# Patient Record
Sex: Male | Born: 1970 | Race: White | Hispanic: No | Marital: Married | Smoking: Never smoker
Health system: Southern US, Community
[De-identification: ages and names within clinical notes are randomized; demographics above are authoritative.]

---

## 2007-05-31 ENCOUNTER — Ambulatory Visit: Payer: Self-pay | Admitting: Sports Medicine

## 2007-05-31 DIAGNOSIS — M25569 Pain in unspecified knee: Secondary | ICD-10-CM | POA: Insufficient documentation

## 2013-11-03 DIAGNOSIS — I10 Essential (primary) hypertension: Secondary | ICD-10-CM | POA: Insufficient documentation

## 2013-11-03 DIAGNOSIS — N50819 Testicular pain, unspecified: Secondary | ICD-10-CM | POA: Insufficient documentation

## 2014-10-12 DIAGNOSIS — F419 Anxiety disorder, unspecified: Secondary | ICD-10-CM | POA: Insufficient documentation

## 2015-05-17 ENCOUNTER — Encounter: Payer: Self-pay | Admitting: Family Medicine

## 2015-05-17 ENCOUNTER — Ambulatory Visit (INDEPENDENT_AMBULATORY_CARE_PROVIDER_SITE_OTHER): Payer: BC Managed Care – PPO | Admitting: Family Medicine

## 2015-05-17 VITALS — BP 131/74 | HR 61 | Ht 73.0 in | Wt 180.0 lb

## 2015-05-17 DIAGNOSIS — M25561 Pain in right knee: Secondary | ICD-10-CM | POA: Diagnosis not present

## 2015-05-17 NOTE — Progress Notes (Signed)
Patient ID: Martin York, male   DOB: November 09, 1970, 44 y.o.   MRN: [id number] Attending Note: I have seen and examined this patient. I have discussed this patient with the resident and reviewed the assessment and plan as documented above. I agree with the resident's findings and plan. Right knee pain, mostly medial. Pain occurs mostly with running, rarely with cycling. I suspect he has a little bit at all meniscal injury given his prior history of anterior cruciate ligament rupture and repair. I think at this point were obligated to get an MRI. Ultrasound today didn't show anything unusual including his menisci which appear to be normal. He did have a few osteophytes seen but otherwise negative ultrasound. Given his high demand activities running cycling etc. of think getting an MRI will give [place] a better idea of future problems and options for treatment right now.

## 2015-05-17 NOTE — Patient Instructions (Signed)
Thanks for coming in today.   We will get an MRI of your knee. You will be called with the appointment time.   In the meantime, ice your knee whenever it hurts, and you can perform activity as you are able to tolerate.   If you have any questions, or need anything, don't hesitate to call.   Thanks for letting us take care of you.   Sincerely,  Devota Pace, MD Family Medicine - PGY 2

## 2015-05-17 NOTE — Progress Notes (Signed)
   [facility] Yolande Jolly, MD Phone: [phone]  Subjective:   # Right Knee Pain - Pain on the medial aspect of the right knee for over one year now. - Patient has had a prior anterior cruciate ligament injury with 2 scopes and an anterior cruciate ligament repair after sustaining an injury in soccer during which she planted and twisted inward while being struck from the side by another player. - He is an avid cyclist, and was running, but the pain has stopped him for money at this point. - He does not get much pain when cycling. - He is a Runner, broadcasting/film/video, and he does get some pain with standing for long periods of time. - Pain is worse with running downhill, worse with foot strike, and not particularly worse at any point during his run, hurts the same the entire time. - He denies joint instability, clicks, locking, giving way. - Has not required pain medication. - Has not done any home rehabilitation. - Says he comes in today because the pain is getting worse. - His knee does not swell. - He gets relief with rest.   All relevant systems were reviewed and were negative unless otherwise noted in the HPI  Past Medical History Reviewed problem list.  Medications- reviewed and updated Current Outpatient Prescriptions  Medication Sig Dispense Refill  . chlorthalidone (HYGROTON) 25 MG tablet Take 25 mg by mouth.     No current facility-administered medications for this visit.   Chief complaint-noted No additions to family history Social history- patient is a non smoker  Objective: BP 124/71 mmHg  Pulse 58  Ht  (1.626 m)  Wt 165 lb (74.844 kg)  BMI 28.31 kg/m2  INSPECTION: No gross deformity, no swelling, no erythema.  PALPATION: Tenderness on the medial aspect at the joint line, and just below the joint line. No tenderness to palpation at any other area of the knee. Some mild crepitus noted. No effusion palpable. No warmth.  RANGE OF MOTION: Full range  of motion from 0-150/160  STRENGTH: 5 out of 5 strength with flexion, extension.  SPECIAL TESTS: Lockman negative, anterior forte/posterior drawer negative, McMurray/Thessaly negative, no medial or lateral collateral ligament laxity.  NEUROVASCULAR STATUS: Neurovascularly intact.    Assessment/Plan:  # Right knee pain- patient with right knee pain most likely related to his prior injury during which he sustained an anterior cruciate ligament tear from a lateral blow. This seems to be most consistent with a probable meniscal tear based on historian based on his symptoms. Nonspecific, possible small tear on ultrasound.  - Plan to get an MRI to further evaluate the knee.  - Activity as tolerated  - Ice if he has pain, ibuprofen if he needs it.  -We will await MRI results, and have him return once we get those back. Further intervention as indicated.

## 2015-05-31 ENCOUNTER — Other Ambulatory Visit: Payer: BC Managed Care – PPO

## 2016-05-15 LAB — HIV ANTIBODY (ROUTINE TESTING W REFLEX): HIV 1&2 Ab, 4th Generation: NONREACTIVE

## 2016-05-22 ENCOUNTER — Other Ambulatory Visit: Payer: Self-pay | Admitting: Sports Medicine

## 2016-06-03 ENCOUNTER — Ambulatory Visit
Admission: RE | Admit: 2016-06-03 | Discharge: 2016-06-03 | Disposition: A | Discharge status: Home / Self Care | Payer: BC Managed Care – PPO | Source: Ambulatory Visit | Attending: Sports Medicine | Admitting: Sports Medicine

## 2016-06-08 ENCOUNTER — Other Ambulatory Visit: Payer: Self-pay | Admitting: Sports Medicine

## 2016-06-08 DIAGNOSIS — K7030 Alcoholic cirrhosis of liver without ascites: Secondary | ICD-10-CM

## 2016-06-16 ENCOUNTER — Ambulatory Visit
Admission: RE | Admit: 2016-06-16 | Discharge: 2016-06-16 | Disposition: A | Discharge status: Home / Self Care | Payer: BC Managed Care – PPO | Source: Ambulatory Visit | Attending: Sports Medicine | Admitting: Sports Medicine

## 2016-06-16 DIAGNOSIS — K7030 Alcoholic cirrhosis of liver without ascites: Secondary | ICD-10-CM

## 2016-06-16 MED ORDER — GADOXETATE DISODIUM 0.25 MMOL/ML IV SOLN
10.0000 mL | Freq: Once | INTRAVENOUS | Status: AC | PRN
  Administered 2016-06-16: 8 mL via INTRAVENOUS

## 2016-06-22 ENCOUNTER — Encounter (INDEPENDENT_AMBULATORY_CARE_PROVIDER_SITE_OTHER): Payer: Self-pay | Admitting: Sports Medicine

## 2016-06-22 ENCOUNTER — Ambulatory Visit (INDEPENDENT_AMBULATORY_CARE_PROVIDER_SITE_OTHER): Payer: BC Managed Care – PPO | Admitting: Sports Medicine

## 2016-06-22 VITALS — BP 134/71 | HR 46 | Ht 73.0 in | Wt 175.0 lb

## 2016-06-22 DIAGNOSIS — K7460 Unspecified cirrhosis of liver: Secondary | ICD-10-CM

## 2016-06-22 LAB — CBC AND DIFFERENTIAL
HEMATOCRIT: 41 (ref 41–53)
Hemoglobin: 14.1 (ref 13.5–17.5)
Platelets: 218 (ref 150–399)
WBC: 5.8

## 2016-06-22 LAB — HEPATIC FUNCTION PANEL
ALT: 32 (ref 10–40)
AST: 31 (ref 14–40)
Alkaline Phosphatase: 43 (ref 25–125)
Bilirubin, Total: 1

## 2016-06-22 LAB — PROTIME-INR: PROTIME: 11.2 (ref 10.0–13.8)

## 2016-06-22 LAB — BASIC METABOLIC PANEL
BUN: 15 (ref 4–21)
Creatinine: 1 (ref 0.6–1.3)
GLUCOSE: 80
POTASSIUM: 4 (ref 3.4–5.3)
Sodium: 141 (ref 137–147)

## 2016-06-22 LAB — POCT INR: INR: 1.1 (ref 0.9–1.1)

## 2016-06-22 NOTE — Progress Notes (Deleted)
   Office Visit Note   Patient: Martin York           Date of Birth: 08-May-1971           MRN: [id number] Visit Date: 06/22/2016              PCP: Andrena MewsMichael D Tigran Haynie, DO  Assessment & Plan: Visit Diagnoses: No diagnosis found.  Plan: ***  Follow-Up Instructions: No Follow-up on file.   Orders:  No orders of the defined types were placed in this encounter.  No orders of the defined types were placed in this encounter.     Procedures: No procedures performed   Clinical Data: No additional findings.   Subjective: Chief Complaint  Patient presents with  . Follow-up    Post MRI- Pt. here to discuss MRI results    HPI  Review of Systems   Objective: Vital Signs: BP 134/71   Pulse (!) 46   Ht 6\' 1"  (1.854 m)   Wt 175 lb (79.4 kg)   BMI 23.09 kg/m   Physical Exam  Ortho Exam  Specialty Comments:  No specialty comments available.  Imaging: No results found.   PMFS History: Patient Active Problem List   Diagnosis Date Noted  . KNEE PAIN, RIGHT 05/31/2007   No past medical history on file.  No family history on file.  No past surgical history on file. Social History   Occupational History  . Not on file.   Social History Main Topics  . Smoking status: Never Smoker  . Smokeless tobacco: Not on file  . Alcohol use Not on file  . Drug use: Unknown  . Sexual activity: Not on file

## 2016-06-22 NOTE — Progress Notes (Signed)
Martin York - 45 y.o. male MRN [id number]  Date of birth: 1971/02/24  Office Visit Note: Visit Date: 06/22/2016 PCP: Andrena MewsMichael D Rigby, DO Referred by: Everlene OtherBouska   Assessment & Plan: Visit Diagnoses:  1. Cirrhosis of liver without ascites, unspecified hepatic cirrhosis type (HCC)   2. Hyperbilirubinemia    Plan: Long discussion with the patient today regarding long-term use of alcohol. 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 Does report some use of prior IM testosterone use but only short-term use. Given the current findings discussed that using alcohol in any form should be avoided & he is agreeable with this. We will plan to check labs as below & if any abnormalities referral to gastroenterology will be indicated however if AFP levels are stable I am comfortable trending his bilirubin every 3-4 months as long as he continues to avoid alcohol. He is taking milk thistle over-the-counter & I agree this is likely okay to continue. Once again if any unexpected abnormalities on lab work today formal evaluation by gastroenterology & consideration of liver biopsy should be considered.  F/u MRI in 6 months.  Follow-Up Instructions: Return in about 3 months (around 09/22/2016) for for repeat lab work.   Orders:  Orders Placed This Encounter  Procedures  . MR ABDOMEN MRCP W WO CONTAST  . Comprehensive metabolic panel  . CBC  . AFP tumor marker  . INR/PT  . Bilirubin, fractionated (tot/dir/indir)  . IBC panel  No orders of the defined types were placed in this encounter.  No procedures performed  No notes on file   Clinical History: Findings:  Lab abnormalities dating back to 2016 revealed an elevated total bilirubin of 1.6 that is remaining stable with a direct bilirubin of 0.3 most recently on 05/14/2016.  ALT & AST values have  normalized although markedly elevated when worked up at an outside provider. Viral Hepatitis panel is negative  MRI of the abdomen obtained following abnormal ultrasound which revealed: Diffuse contour irregularity consistent with cirrhosis with large area of heterogeneous signal alteration posteriorly in the right hepatic lobe most consistent with confluent hepatic fibrosis. No focal mass lesion. Will need follow-up MRI in 6 months  No specialty comments available.  Subjective: Chief Complaint  Patient presents with  . Follow-up    Post MRI- Pt. here to discuss MRI results   Patient here for follow-up of MRI of the abdomen. Overall feeling well no focal physical complaints. Has remained highly active with cycling. Reports fairly heavy alcohol use but has been able to remain sober for the past several months without difficulty. 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  He denies any chronic skin changes. No appreciable evidence of jaundice. No nausea vomiting or significant fatigue related. No issues with DTs or withdrawal-like symptoms. No history of reported IV drug use.   Review of Systems  Constitutional: Negative.        Otherwise per HPI    Objective: Vital Signs: BP 134/71   Pulse (!) 46   Ht 6\' 1"  (1.854 m)   Wt 175 lb (79.4 kg)   BMI 23.09 kg/m Physical Exam  Constitutional: He appears well-developed and well-nourished. No distress.  Eyes: Right eye exhibits no discharge. Left eye exhibits no discharge. No scleral icterus.  Pulmonary/Chest: Effort  normal. No respiratory distress.  Abdominal: Soft.  Skin: He is not diaphoretic.  Psychiatric: He has a normal mood and affect. Judgment normal.   Ortho Exam Imaging: No results found.  Past Medical/Family/Surgical/Social History: Patient Active Problem List   Diagnosis Date Noted  . Cirrhosis of liver without ascites (HCC) 06/22/2016  . Hyperbilirubinemia 06/22/2016  . KNEE PAIN, RIGHT  05/31/2007   No past medical history on file.   No family history on file.   No past surgical history on file.  Social History   Occupational History  . Teacher [employer]    [employer]   Social History Main Topics  . Smoking status: Never Smoker  . Smokeless tobacco: Not on file  . Alcohol use 0.0 oz/week  . Drug use: Unknown  . Sexual activity: Not on file

## 2016-07-02 ENCOUNTER — Encounter (INDEPENDENT_AMBULATORY_CARE_PROVIDER_SITE_OTHER): Payer: Self-pay | Admitting: Sports Medicine

## 2016-07-03 NOTE — Progress Notes (Signed)
Patient's labs otherwise completely normalize including bilirubin & liver enzymes. Given the marginally elevated AFP I am comfortable rechecking this in 3 months to ensure resolution of the values. If persistent elevation at that time further workup by gastroenterology will be indicated.  Did call & leave a message with the patient today & have tried reaching up to multiple times during the week but is been unavailable. I will try totouch base with him next week to ensure that he agrees with the plan. Alternatively we can go ahead & refer him to gastroenterology at this time but my suspicion for any type of hepatocellular carcinoma is extraordinarily low given the normalization of his bilirubin & liver enzymes.

## 2017-01-07 ENCOUNTER — Telehealth: Payer: Self-pay

## 2017-01-07 NOTE — Telephone Encounter (Signed)
Left voicemail & telephone encounter note for patient to call main office and schedule follow up with Dr Berline Choughigby.

## 2017-01-07 NOTE — Telephone Encounter (Signed)
-----   Message from Andrena MewsMichael D Rigby, DO sent at 01/07/2017  8:33 AM EDT ----- Regarding: FW: Recheck Labs Please call and have pt schedule a follow up appointment with me to recheck labs ----- Message ----- From: Andrena Mewsigby, Michael D, DO Sent: 08/20/2016  11:27 AM To: Andrena MewsMichael D Rigby, DO Subject: Recheck Labs                                   Needs follow up AFP and LFTs

## 2017-02-12 ENCOUNTER — Telehealth: Payer: Self-pay

## 2017-02-12 DIAGNOSIS — K7460 Unspecified cirrhosis of liver: Secondary | ICD-10-CM

## 2017-02-12 NOTE — Telephone Encounter (Signed)
[facility] [address]. [facility] and will fax to draw station.

## 2017-02-15 LAB — CBC
HEMATOCRIT: 40.7 % (ref 38.5–50.0)
Hemoglobin: 13.7 g/dL (ref 13.2–17.1)
MCH: 29.3 pg (ref 27.0–33.0)
MCHC: 33.7 g/dL (ref 32.0–36.0)
MCV: 87 fL (ref 80.0–100.0)
MPV: 10 fL (ref 7.5–12.5)
Platelets: 223 10*3/uL (ref 140–400)
RBC: 4.68 MIL/uL (ref 4.20–5.80)
RDW: 12.9 % (ref 11.0–15.0)
WBC: 5.6 10*3/uL (ref 3.8–10.8)

## 2017-02-16 LAB — COMPREHENSIVE METABOLIC PANEL
ALBUMIN: 4.5 g/dL (ref 3.6–5.1)
ALK PHOS: 43 U/L (ref 40–115)
ALT: 18 U/L (ref 9–46)
AST: 21 U/L (ref 10–40)
BUN: 11 mg/dL (ref 7–25)
CALCIUM: 9.2 mg/dL (ref 8.6–10.3)
CHLORIDE: 104 mmol/L (ref 98–110)
CO2: 28 mmol/L (ref 20–31)
Creat: 0.93 mg/dL (ref 0.60–1.35)
Glucose, Bld: 80 mg/dL (ref 65–99)
Potassium: 4 mmol/L (ref 3.5–5.3)
Sodium: 140 mmol/L (ref 135–146)
TOTAL PROTEIN: 6.2 g/dL (ref 6.1–8.1)
Total Bilirubin: 2 mg/dL — ABNORMAL HIGH (ref 0.2–1.2)

## 2017-02-16 LAB — IRON AND TIBC
%SAT: 39 % (ref 15–60)
Iron: 112 ug/dL (ref 50–180)
TIBC: 287 ug/dL (ref 250–425)
UIBC: 175 ug/dL

## 2017-02-16 LAB — BILIRUBIN, FRACTIONATED(TOT/DIR/INDIR)
Bilirubin, Direct: 0.3 mg/dL — ABNORMAL HIGH (ref <=0.2)
Indirect Bilirubin: 1.7 mg/dL — ABNORMAL HIGH (ref 0.2–1.2)
Total Bilirubin: 2 mg/dL — ABNORMAL HIGH (ref 0.2–1.2)

## 2017-02-16 LAB — BILIRUBIN,DIRECT & INDIRECT (FRACTIONATED)
BILIRUBIN DIRECT: 0.3 mg/dL — AB (ref <=0.2)
Indirect Bilirubin: 1.7 mg/dL — ABNORMAL HIGH (ref 0.2–1.2)

## 2017-02-16 LAB — PROTIME-INR
INR: 1.1
PROTHROMBIN TIME: 11.3 s (ref 9.0–11.5)

## 2017-02-16 LAB — AFP TUMOR MARKER: AFP-Tumor Marker: 5.2 ng/mL (ref <6.1)

## 2017-02-17 ENCOUNTER — Ambulatory Visit: Payer: BC Managed Care – PPO | Admitting: Sports Medicine

## 2017-02-17 ENCOUNTER — Telehealth: Payer: Self-pay

## 2017-02-17 NOTE — Telephone Encounter (Signed)
Notes recorded by Andrena Mewsigby, Michael D, DO on 02/17/2017 at 3:10 PM EDT AFP level has normalized. Persistently elevated bilirubin. Liver enzymes are normal. Bilirubin is slightly higher than in the past but not significantly worsened. Would like to plan to see him back in clinic to review these results and talk about the next appropriate step. Please call to schedule an appointment

## 2017-02-17 NOTE — Telephone Encounter (Signed)
Called pt and left VM for pt to contact the office.

## 2017-02-18 ENCOUNTER — Encounter: Payer: Self-pay | Admitting: Sports Medicine

## 2017-02-18 ENCOUNTER — Ambulatory Visit (INDEPENDENT_AMBULATORY_CARE_PROVIDER_SITE_OTHER): Payer: BC Managed Care – PPO | Admitting: Sports Medicine

## 2017-02-18 VITALS — BP 122/100 | HR 42 | Ht 73.0 in | Wt 179.4 lb

## 2017-02-18 DIAGNOSIS — I10 Essential (primary) hypertension: Secondary | ICD-10-CM | POA: Diagnosis not present

## 2017-02-18 DIAGNOSIS — Z23 Encounter for immunization: Secondary | ICD-10-CM | POA: Diagnosis not present

## 2017-02-18 DIAGNOSIS — K7460 Unspecified cirrhosis of liver: Secondary | ICD-10-CM

## 2017-02-18 NOTE — Progress Notes (Signed)
OFFICE VISIT NOTE Veverly FellsMichael D. Delorise Shinerigby, DO  [facility] [place] [phone]  Dorene ArRobert B Callens - 46 y.o. male MRN [id number]  Date of birth: [birth year]  Visit Date: 02/18/2017  PCP: Andrena Mewsigby, Michael D, DO   Referred by: Andrena Mewsigby, Michael D, DO  Clovis CaoAutumn McNeil, cma acting as scribe for Dr. Berline Choughigby.  SUBJECTIVE:   Chief Complaint  Patient presents with  . Follow-up  . Cirrhosis of liver w/o ascites  . Jaundice   HPI: As below and per problem based documentation when appropriate.  That is here for follow-up of elevated liver enzyme tests and a marginally elevated AFP previously.  He reports overall feeling well, continues to remain active running up to 200 miles per week on his bicycle during the summer. He is sober.    Review of Systems  Constitutional: Negative for chills, diaphoresis, fever, malaise/fatigue and weight loss.  HENT: Positive for tinnitus. Negative for congestion, ear discharge, ear pain, hearing loss, nosebleeds, sinus pain and sore throat.   Eyes: Negative for blurred vision, double vision, photophobia, pain, discharge and redness.  Respiratory: Negative for cough, hemoptysis, sputum production, shortness of breath, wheezing and stridor.   Cardiovascular: Negative for chest pain, palpitations, orthopnea, claudication, leg swelling and PND.  Gastrointestinal: Negative for abdominal pain, blood in stool, constipation, diarrhea, heartburn, melena, nausea and vomiting.  Genitourinary: Negative for dysuria, flank pain, frequency, hematuria and urgency.  Musculoskeletal: Negative for back pain, falls, joint pain, myalgias and neck pain.  Skin: Negative for rash.  Neurological: Negative for dizziness, tingling, tremors, sensory change, speech change, focal weakness, seizures, loss of consciousness, weakness and headaches.  Endo/Heme/Allergies: Negative for environmental allergies and polydipsia. Does not bruise/bleed easily.    Psychiatric/Behavioral: Negative for depression, hallucinations, memory loss and suicidal ideas. The patient is not nervous/anxious and does not have insomnia.     Otherwise per HPI.  HISTORY & PERTINENT PRIOR DATA:  No specialty comments available. He reports that he has never smoked. He has never used smokeless tobacco. No results for input(s): HGBA1C, LABURIC in the last 8760 hours. Medications & Allergies reviewed per EMR Patient Active Problem List   Diagnosis Date Noted  . Cirrhosis of liver without ascites (HCC) 06/22/2016  . Hyperbilirubinemia 06/22/2016  . Anxiety 10/12/2014  . Essential hypertension 11/03/2013   History reviewed. No pertinent past medical history. History reviewed. No pertinent family history. History reviewed. No pertinent surgical history. Social History   Occupational History  . Teacher [employer]    [employer]   Social History Main Topics  . Smoking status: Never Smoker  . Smokeless tobacco: Never Used  . Alcohol use 0.0 oz/week  . Drug use: Unknown  . Sexual activity: Not on file    OBJECTIVE:  VS:  HT:6\' 1"  (185.4 cm)   WT:179 lb 6.4 oz (81.4 kg)  BMI:23.7    BP:(!) 122/100  HR:(!) 42bpm  TEMP: ( )  RESP:99 % EXAM: Findings:  Adult male.  No acute distress.  Alert and appropriate.  Possible trace scleral icterus but this is minimal.  No edema. Bilateral radial pulses 2+/4.  No pretibial edema or ascites.  His abdomen is soft and nontender is thin.  Liver is palpable 2 fingerbreadths below the costal margin. Heart with regular rate and rhythm, S1-S2 is heard.  No murmur.  Lungs clear to auscultation bilaterally.     No results found. ASSESSMENT & PLAN:   Problem List Items Addressed This Visit  Cirrhosis of liver without ascites (HCC)    Labs are stable.  AFP is normal.  Slight increase in his bilirubin but overall synthetic function of the liver is stable.  Repeat MRI of the abdomen indicated given prior findings  and recommendations for repeat at this time.  We will plan to have him continue to remain sober which she reports doing well with and has no concerns.  No other hepatotoxic medications or supplements at this time.  If any significant worsening does not understand my recommendation is for him to be seen by gastroenterology but he would like to hold off at this time.      Relevant Orders   MR Abdomen W Wo Contrast   Hyperbilirubinemia   Relevant Orders   MR Abdomen W Wo Contrast   Essential hypertension    Blood pressure has been fluctuating in the past. Home BP monitoring recommended if persistently elevated will need to discuss possibility of initiating medications        Other Visit Diagnoses    Need for Tdap vaccination    -  Primary   Relevant Orders   Tdap vaccine greater than or equal to 7yo IM (Completed)      Follow-up: Return in about 6 months (around 08/20/2017).    CMA/ATC served as Neurosurgeon during this visit. History, Physical, and Plan performed by medical provider. Documentation and orders reviewed and attested to.      Gaspar Bidding, DO    [facility]

## 2017-02-18 NOTE — Telephone Encounter (Signed)
Pt on the schedule to see Dr. Berline Choughigby today.

## 2017-02-19 ENCOUNTER — Telehealth: Payer: Self-pay | Admitting: Sports Medicine

## 2017-02-19 ENCOUNTER — Ambulatory Visit: Payer: BC Managed Care – PPO | Admitting: Sports Medicine

## 2017-02-19 NOTE — Telephone Encounter (Signed)
Patient called to inquire about Martin ChoughRigby being his PCP however, having to pay a copay due to him being a specialist. I explained to the patient per [name] that he can continue to see [name] for PCP issues/visits however, he will have to pay as a specialty due to the change in [name]'s type of services.   I encouraged the patient that if he liked the location to establish with one of our PCP's here if it is convenient. Patient stated that is was not and he will consider finding another PCP so he does not have to pay his specialty copay.

## 2017-03-01 NOTE — Assessment & Plan Note (Addendum)
Labs are stable.  AFP is normal.  Slight increase in his bilirubin but overall synthetic function of the liver is stable.  Repeat MRI of the abdomen indicated given prior findings and recommendations for repeat at this time.  We will plan to have him continue to remain sober which she reports doing well with and has no concerns.  No other hepatotoxic medications or supplements at this time.  If any significant worsening does not understand my recommendation is for him to be seen by gastroenterology but he would like to hold off at this time.

## 2017-03-01 NOTE — Assessment & Plan Note (Signed)
Blood pressure has been fluctuating in the past. Home BP monitoring recommended if persistently elevated will need to discuss possibility of initiating medications

## 2017-03-07 ENCOUNTER — Encounter: Payer: Self-pay | Admitting: Sports Medicine

## 2017-03-23 ENCOUNTER — Telehealth: Payer: Self-pay

## 2017-03-23 ENCOUNTER — Other Ambulatory Visit: Payer: Self-pay

## 2017-03-23 DIAGNOSIS — Z0184 Encounter for antibody response examination: Secondary | ICD-10-CM

## 2017-03-23 DIAGNOSIS — K7460 Unspecified cirrhosis of liver: Secondary | ICD-10-CM

## 2017-03-23 NOTE — Telephone Encounter (Signed)
A user error has taken place: encounter opened in error, closed for administrative reasons.

## 2017-03-23 NOTE — Telephone Encounter (Signed)
Lab orders placed for Hep A and Hep B titer as well as bilirubin.

## 2017-03-24 ENCOUNTER — Other Ambulatory Visit: Payer: Self-pay

## 2017-03-24 ENCOUNTER — Telehealth: Payer: Self-pay

## 2017-03-24 DIAGNOSIS — K7460 Unspecified cirrhosis of liver: Secondary | ICD-10-CM

## 2017-03-24 LAB — BILIRUBIN, FRACTIONATED(TOT/DIR/INDIR)
BILIRUBIN DIRECT: 0.2 mg/dL (ref <=0.2)
BILIRUBIN INDIRECT: 0.9 mg/dL (ref 0.2–1.2)
BILIRUBIN TOTAL: 1.1 mg/dL (ref 0.2–1.2)

## 2017-03-24 NOTE — Telephone Encounter (Signed)
[facility] called requesting to have different order placed for fractionated bilirubin. First order was incorrect.

## 2017-03-25 ENCOUNTER — Encounter: Payer: Self-pay | Admitting: Sports Medicine

## 2017-03-25 LAB — HEPATITIS A ANTIBODY, IGM: HEP A IGM: NONREACTIVE

## 2017-03-25 LAB — HEPATITIS B SURFACE ANTIBODY,QUALITATIVE: Hep B S Ab: BORDERLINE — AB

## 2017-03-29 ENCOUNTER — Encounter: Payer: Self-pay | Admitting: Sports Medicine

## 2017-03-29 ENCOUNTER — Telehealth: Payer: Self-pay

## 2017-03-29 NOTE — Telephone Encounter (Signed)
FYI from MyChart message:  Martin StallionHi Martin York!  I have an appointment with Dr. Maryelizabeth RowanElizabeth York on Aug. 1 this week.  I decided to look for another PCP as it seems that you are focusing more on sport medicine and I would have to pay a specialist copay each time I see you.  I really appreciate all of your help and insight over the last year in figuring out what is going on with my liver and getting me back on track.  I do still plan to follow up with the Liver specialist and the MRI in upcoming weeks.  Could you please send copies of my records over to Dr. Chauncy Passyewey's office?  Thanks so much!

## 2017-03-30 NOTE — Telephone Encounter (Signed)
That is totally fine.  She is a great doctor and I know you will be in good hands.  I will try to have them forward records ASAP.  Please let me know if there is anything else we can do for you in the future.  ++++++++++++++++++++++++++++++++++++++++++++  Please forward all his lab results to Dr. Duanne Guessewey

## 2017-03-31 NOTE — Telephone Encounter (Signed)
I have sent a MyChart message with Dr. Janeece Riggersigby's annotations. See other message pt sent. He has faxed a record release form.

## 2017-04-08 ENCOUNTER — Other Ambulatory Visit: Payer: Self-pay | Admitting: Nurse Practitioner

## 2017-04-08 DIAGNOSIS — K7469 Other cirrhosis of liver: Secondary | ICD-10-CM

## 2017-04-08 DIAGNOSIS — K769 Liver disease, unspecified: Secondary | ICD-10-CM

## 2017-04-12 ENCOUNTER — Other Ambulatory Visit: Payer: BC Managed Care – PPO

## 2017-05-08 ENCOUNTER — Ambulatory Visit
Admission: RE | Admit: 2017-05-08 | Discharge: 2017-05-08 | Disposition: A | Discharge status: Home / Self Care | Payer: BC Managed Care – PPO | Source: Ambulatory Visit | Attending: Nurse Practitioner | Admitting: Nurse Practitioner

## 2017-05-08 DIAGNOSIS — K769 Liver disease, unspecified: Secondary | ICD-10-CM

## 2017-05-08 DIAGNOSIS — K7469 Other cirrhosis of liver: Secondary | ICD-10-CM

## 2017-05-08 MED ORDER — GADOXETATE DISODIUM 0.25 MMOL/ML IV SOLN
7.0000 mL | Freq: Once | INTRAVENOUS | Status: AC | PRN
  Administered 2017-05-08: 7 mL via INTRAVENOUS

## 2017-08-20 ENCOUNTER — Ambulatory Visit: Payer: BC Managed Care – PPO | Admitting: Sports Medicine

## 2017-12-31 ENCOUNTER — Other Ambulatory Visit: Payer: Self-pay | Admitting: Nurse Practitioner

## 2017-12-31 DIAGNOSIS — K7469 Other cirrhosis of liver: Secondary | ICD-10-CM

## 2018-02-11 ENCOUNTER — Ambulatory Visit
Admission: RE | Admit: 2018-02-11 | Discharge: 2018-02-11 | Disposition: A | Discharge status: Home / Self Care | Payer: BC Managed Care – PPO | Source: Ambulatory Visit | Attending: Nurse Practitioner | Admitting: Nurse Practitioner

## 2018-02-11 DIAGNOSIS — K7469 Other cirrhosis of liver: Secondary | ICD-10-CM

## 2018-07-15 ENCOUNTER — Other Ambulatory Visit: Payer: Self-pay | Admitting: Nurse Practitioner

## 2018-07-15 DIAGNOSIS — K7469 Other cirrhosis of liver: Secondary | ICD-10-CM

## 2018-08-10 ENCOUNTER — Ambulatory Visit
Admission: RE | Admit: 2018-08-10 | Discharge: 2018-08-10 | Disposition: A | Discharge status: Home / Self Care | Payer: BC Managed Care – PPO | Source: Ambulatory Visit | Attending: Nurse Practitioner | Admitting: Nurse Practitioner

## 2018-08-10 DIAGNOSIS — K7469 Other cirrhosis of liver: Secondary | ICD-10-CM

## 2019-02-16 ENCOUNTER — Other Ambulatory Visit: Payer: Self-pay | Admitting: Nurse Practitioner

## 2019-02-16 DIAGNOSIS — K7030 Alcoholic cirrhosis of liver without ascites: Secondary | ICD-10-CM

## 2019-03-15 ENCOUNTER — Other Ambulatory Visit: Payer: Self-pay

## 2019-03-15 ENCOUNTER — Ambulatory Visit
Admission: RE | Admit: 2019-03-15 | Discharge: 2019-03-15 | Disposition: A | Discharge status: Home / Self Care | Payer: BC Managed Care – PPO | Source: Ambulatory Visit | Attending: Nurse Practitioner | Admitting: Nurse Practitioner

## 2019-03-15 DIAGNOSIS — K7030 Alcoholic cirrhosis of liver without ascites: Secondary | ICD-10-CM

## 2019-03-15 MED ORDER — GADOXETATE DISODIUM 0.25 MMOL/ML IV SOLN
8.0000 mL | Freq: Once | INTRAVENOUS | Status: AC | PRN
  Administered 2019-03-15: 8 mL via INTRAVENOUS

## 2019-08-14 ENCOUNTER — Other Ambulatory Visit: Payer: Self-pay | Admitting: Nurse Practitioner

## 2019-08-14 DIAGNOSIS — K7469 Other cirrhosis of liver: Secondary | ICD-10-CM

## 2019-08-30 ENCOUNTER — Ambulatory Visit
Admission: RE | Admit: 2019-08-30 | Discharge: 2019-08-30 | Disposition: A | Discharge status: Home / Self Care | Payer: BC Managed Care – PPO | Source: Ambulatory Visit | Attending: Nurse Practitioner | Admitting: Nurse Practitioner

## 2019-08-30 DIAGNOSIS — K7469 Other cirrhosis of liver: Secondary | ICD-10-CM

## 2020-02-28 ENCOUNTER — Other Ambulatory Visit: Payer: Self-pay | Admitting: Nurse Practitioner

## 2020-02-28 DIAGNOSIS — K7469 Other cirrhosis of liver: Secondary | ICD-10-CM

## 2020-03-11 ENCOUNTER — Other Ambulatory Visit: Payer: Self-pay | Admitting: Nurse Practitioner

## 2020-03-11 DIAGNOSIS — K7469 Other cirrhosis of liver: Secondary | ICD-10-CM

## 2020-03-26 ENCOUNTER — Ambulatory Visit
Admission: RE | Admit: 2020-03-26 | Discharge: 2020-03-26 | Disposition: A | Discharge status: Home / Self Care | Payer: BC Managed Care – PPO | Source: Ambulatory Visit | Attending: Nurse Practitioner | Admitting: Nurse Practitioner

## 2020-03-26 DIAGNOSIS — K7469 Other cirrhosis of liver: Secondary | ICD-10-CM

## 2020-08-21 ENCOUNTER — Other Ambulatory Visit: Payer: Self-pay | Admitting: Nurse Practitioner

## 2020-08-21 DIAGNOSIS — K7460 Unspecified cirrhosis of liver: Secondary | ICD-10-CM

## 2020-09-12 ENCOUNTER — Ambulatory Visit
Admission: RE | Admit: 2020-09-12 | Discharge: 2020-09-12 | Disposition: A | Discharge status: Home / Self Care | Payer: BC Managed Care – PPO | Source: Ambulatory Visit | Attending: Nurse Practitioner | Admitting: Nurse Practitioner

## 2020-09-12 DIAGNOSIS — K7460 Unspecified cirrhosis of liver: Secondary | ICD-10-CM

## 2021-04-14 ENCOUNTER — Other Ambulatory Visit: Payer: Self-pay | Admitting: Nurse Practitioner

## 2021-04-14 DIAGNOSIS — K7469 Other cirrhosis of liver: Secondary | ICD-10-CM

## 2021-04-24 ENCOUNTER — Ambulatory Visit
Admission: RE | Admit: 2021-04-24 | Discharge: 2021-04-24 | Disposition: A | Discharge status: Home / Self Care | Payer: BC Managed Care – PPO | Source: Ambulatory Visit | Attending: Nurse Practitioner | Admitting: Nurse Practitioner

## 2021-04-24 DIAGNOSIS — K7469 Other cirrhosis of liver: Secondary | ICD-10-CM

## 2021-11-28 ENCOUNTER — Other Ambulatory Visit: Payer: Self-pay | Admitting: Nurse Practitioner

## 2021-11-28 DIAGNOSIS — K7469 Other cirrhosis of liver: Secondary | ICD-10-CM

## 2022-02-09 ENCOUNTER — Ambulatory Visit
Admission: RE | Admit: 2022-02-09 | Discharge: 2022-02-09 | Disposition: A | Discharge status: Home / Self Care | Payer: BC Managed Care – PPO | Source: Ambulatory Visit | Attending: Nurse Practitioner | Admitting: Nurse Practitioner

## 2022-02-09 DIAGNOSIS — K7469 Other cirrhosis of liver: Secondary | ICD-10-CM

## 2022-05-12 IMAGING — US US ABDOMEN LIMITED
1 series · 13 of 25 positions shown · non-contrast
Comparison: 08/30/2019

CLINICAL DATA: [place] cirrhosis, screening for hepatocellular
carcinoma, ascites and evidence of portal hypertension

EXAM:
ULTRASOUND ABDOMEN LIMITED RIGHT UPPER QUADRANT

[Series 1: us abdomen limited · 0.28mm/px · 13 of 66 slices shown]
[im 1/66]
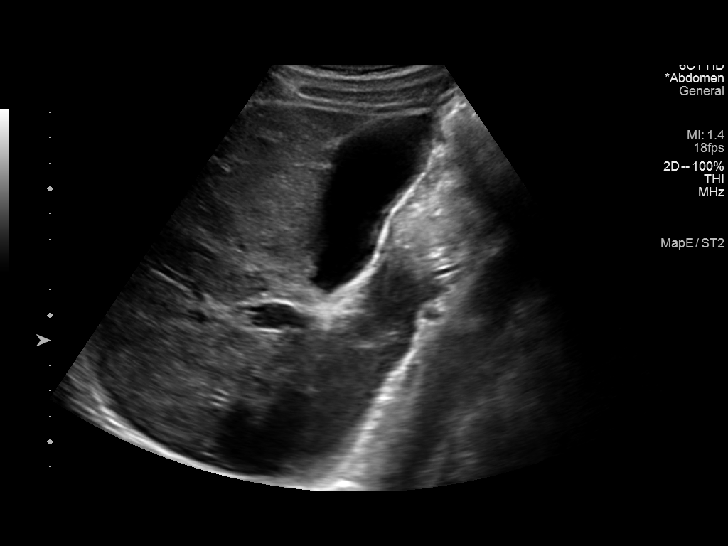
[im 6/66]
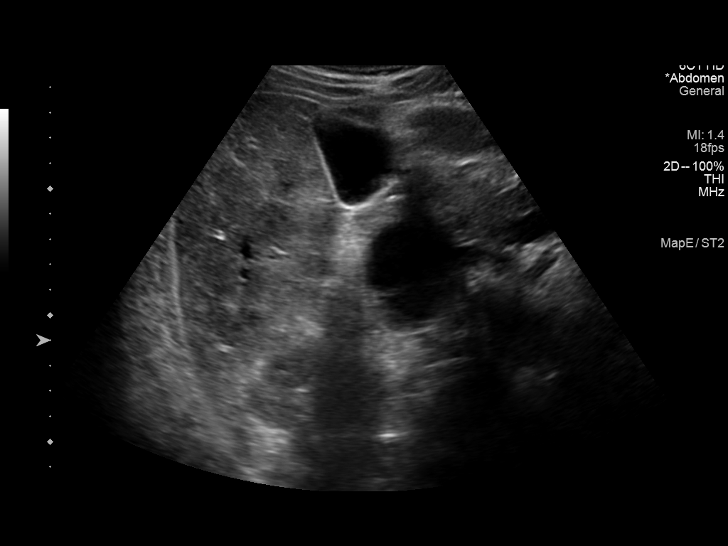
[im 11/66]
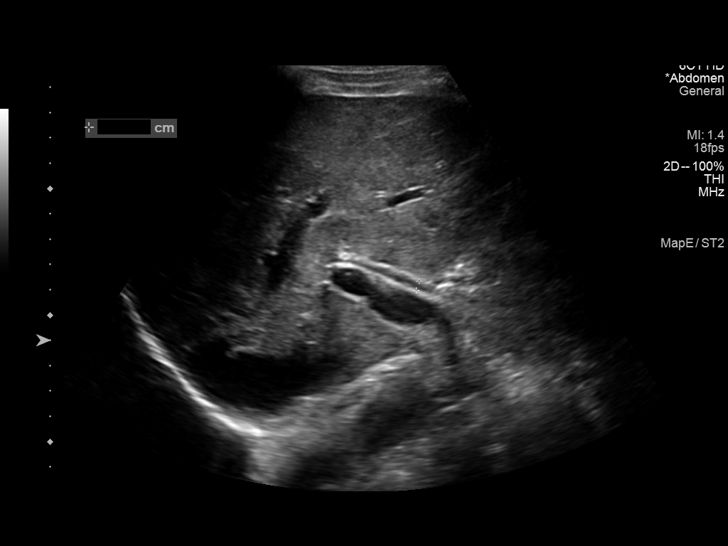
[im 17/66]
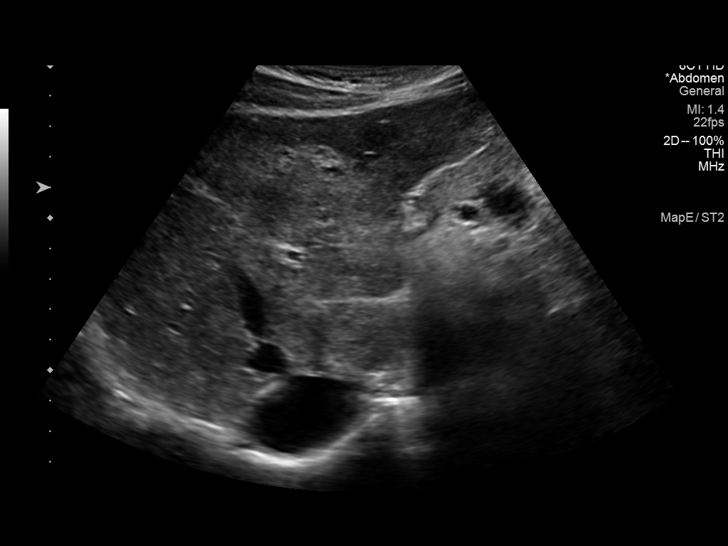
[im 22/66]
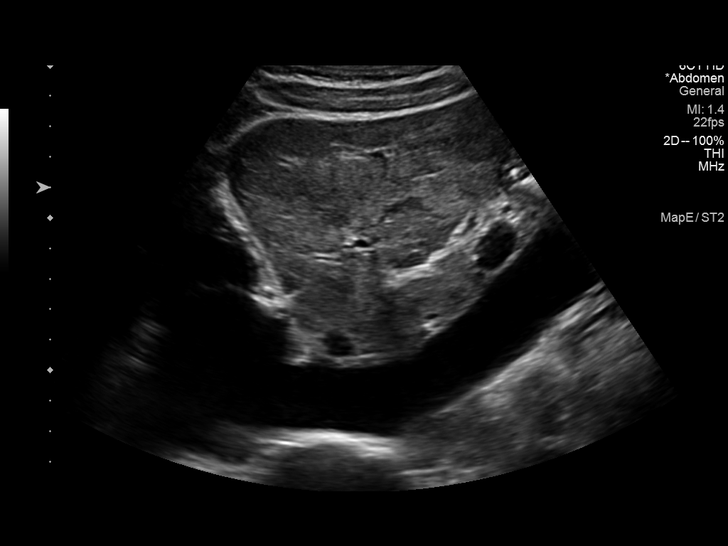
[im 28/66]
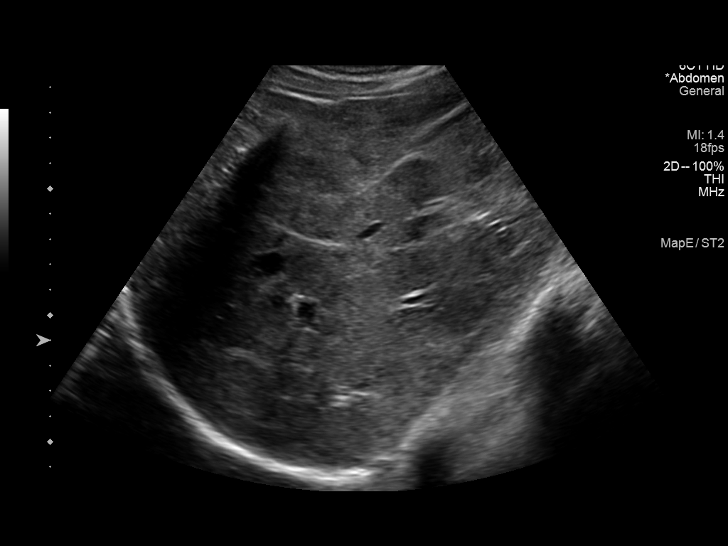
[im 33/66]
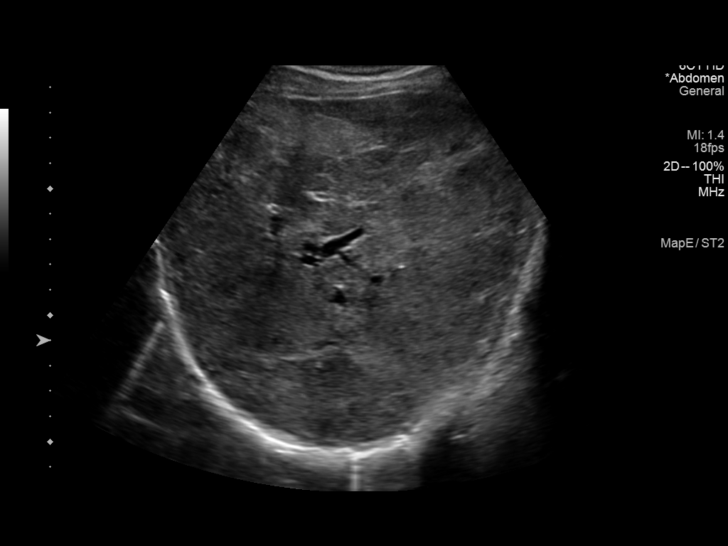
[im 38/66]
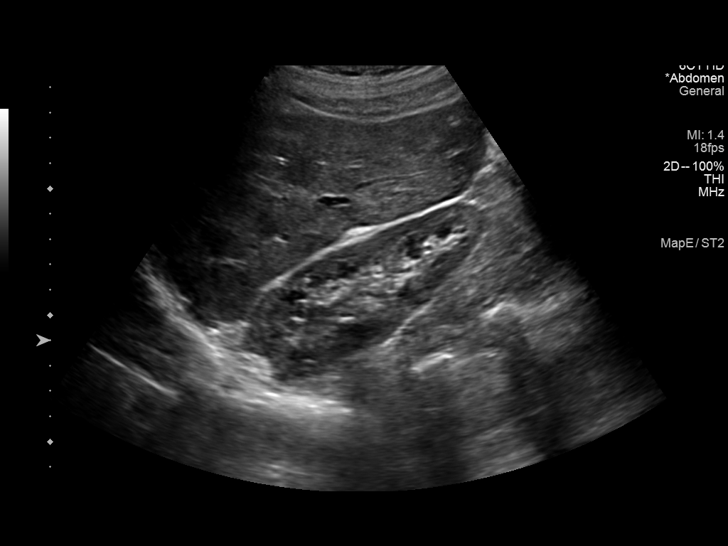
[im 44/66]
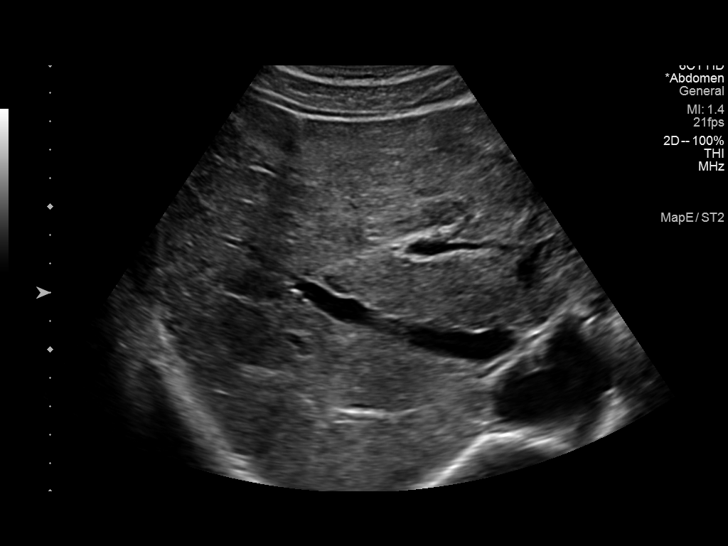
[im 49/66]
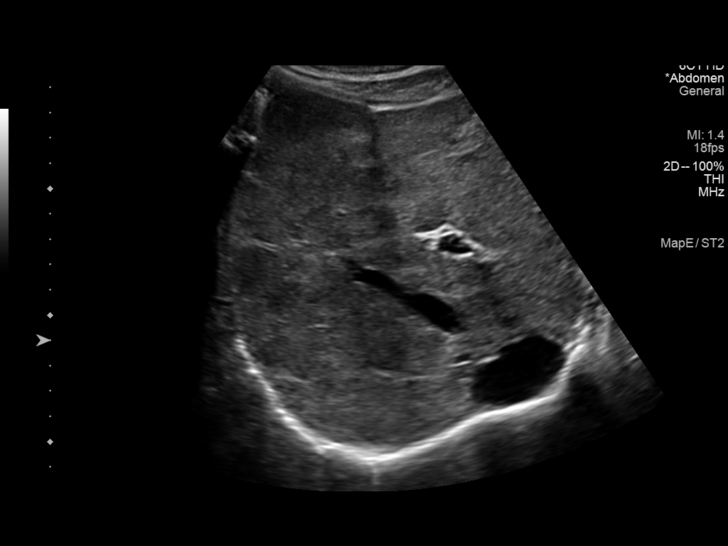
[im 55/66]
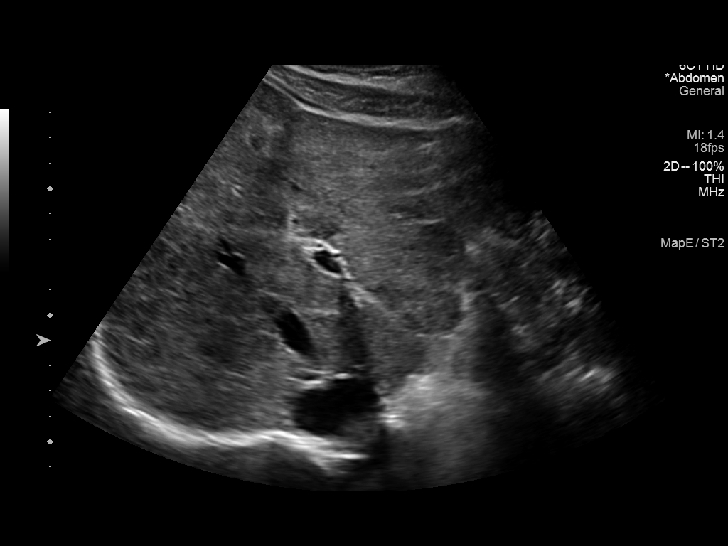
[im 60/66]
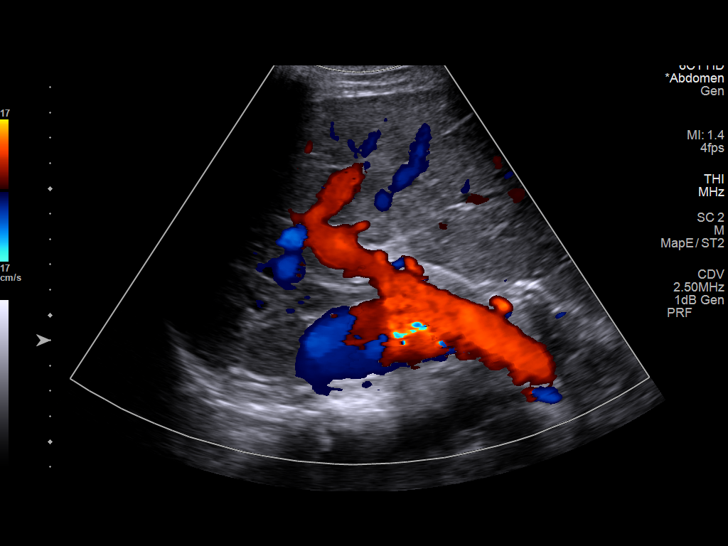
[im 66/66]
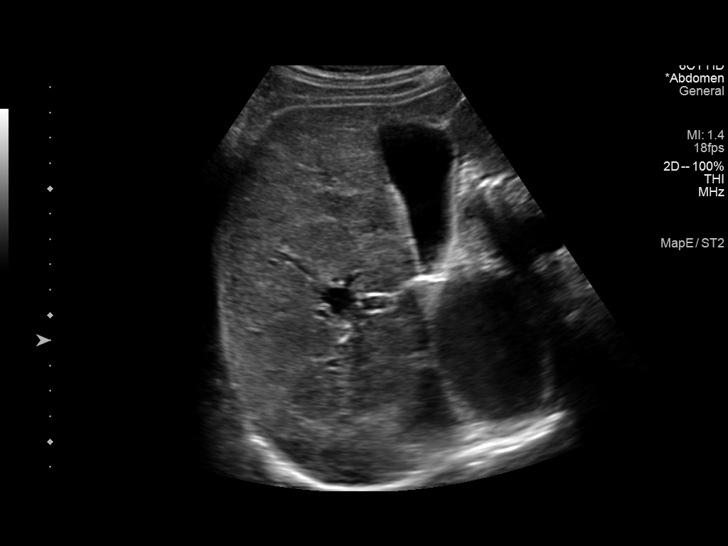

[13 of 25 positions shown; findings below may reference images not displayed]

FINDINGS: Gallbladder:

Normally distended without stones or wall thickening. No
pericholecystic fluid or sonographic Murphy sign.

Common bile duct:

Diameter: 4 mm, normal

Liver:

Slightly nodular hepatic contours with heterogeneous parenchymal
echogenicity consistent with cirrhosis. The observed heterogeneity
is somewhat diffusely nodular in appearance throughout the liver,
without a definite discrete mass. The most prominent area of focal
altered echogenicity is at the anterior LEFT lobe 1.7 x 1.4 x
cm. Portal vein is patent on color Doppler imaging with normal
direction of blood flow towards the liver.

Other: No RIGHT upper quadrant free fluid.
IMPRESSION: Markedly heterogeneous in slightly nodular liver consistent with
cirrhosis.

A single questionable area of nodularity is identified at the LEFT
lobe of the liver 17 mm greatest size, may be related to the focal
nodular appearance of the observed cirrhosis though subtle mass not
entirely excluded; further assessment of the liver by MR imaging
with and without contrast recommended to exclude developing focal
lesion.

## 2022-07-31 ENCOUNTER — Other Ambulatory Visit: Payer: Self-pay | Admitting: Nurse Practitioner

## 2022-07-31 DIAGNOSIS — K7469 Other cirrhosis of liver: Secondary | ICD-10-CM

## 2022-08-21 ENCOUNTER — Ambulatory Visit
Admission: RE | Admit: 2022-08-21 | Discharge: 2022-08-21 | Disposition: A | Discharge status: Home / Self Care | Payer: BC Managed Care – PPO | Source: Ambulatory Visit | Attending: Nurse Practitioner | Admitting: Nurse Practitioner

## 2022-08-21 DIAGNOSIS — K7469 Other cirrhosis of liver: Secondary | ICD-10-CM

## 2022-10-09 ENCOUNTER — Other Ambulatory Visit (HOSPITAL_BASED_OUTPATIENT_CLINIC_OR_DEPARTMENT_OTHER): Payer: Self-pay | Admitting: Family Medicine

## 2022-10-09 DIAGNOSIS — E7800 Pure hypercholesterolemia, unspecified: Secondary | ICD-10-CM

## 2022-10-29 IMAGING — US US ABDOMEN LIMITED
1 series · 13 of 25 positions shown · non-contrast
Comparison: None.

CLINICAL DATA: Hepatic cirrhosis

EXAM:
ULTRASOUND ABDOMEN LIMITED RIGHT UPPER QUADRANT

[Series 1: us abdomen limited · 0.21mm/px · 13 of 47 slices shown]
[im 1/47]
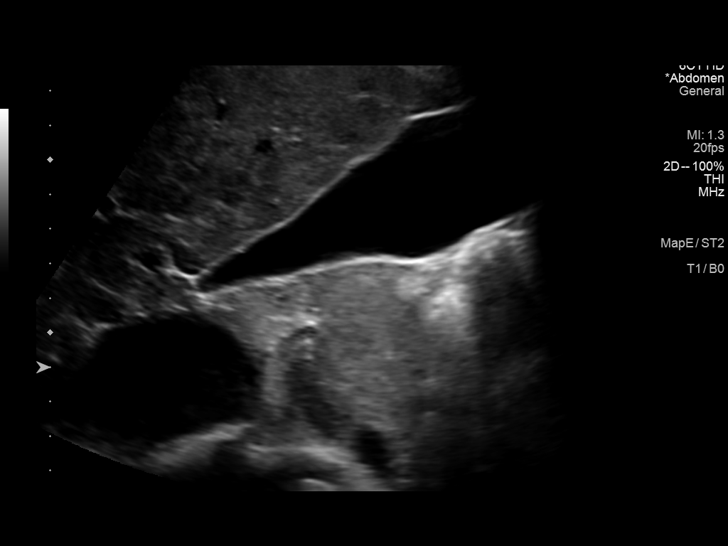
[im 4/47]
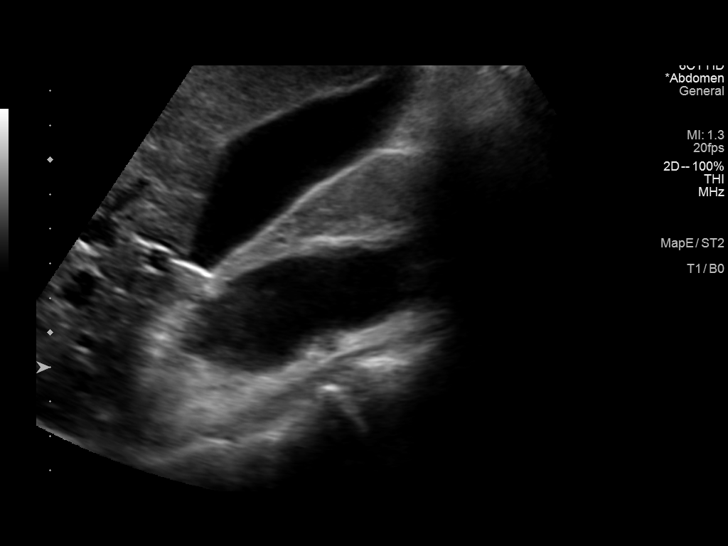
[im 8/47]
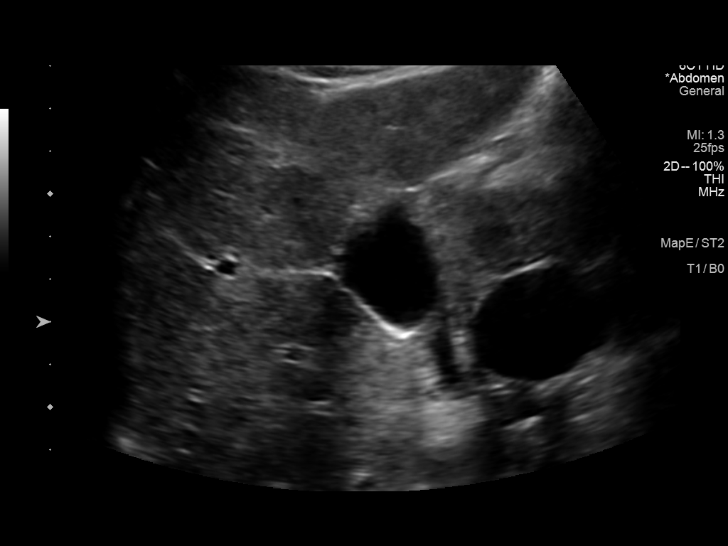
[im 12/47]
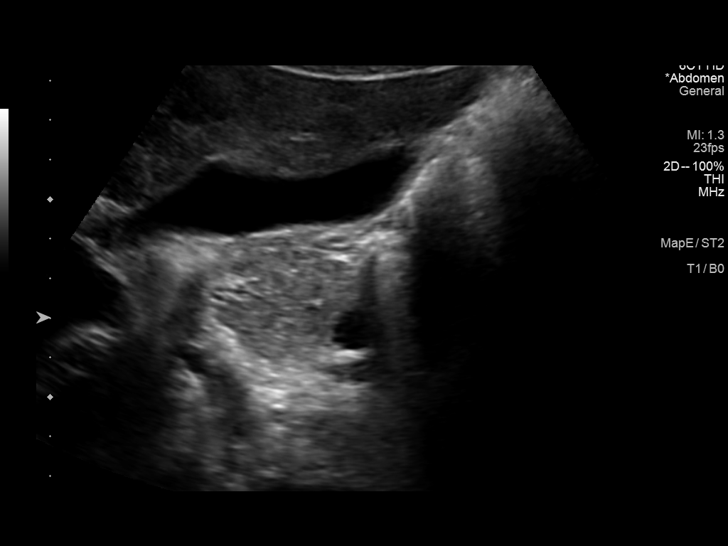
[im 16/47]
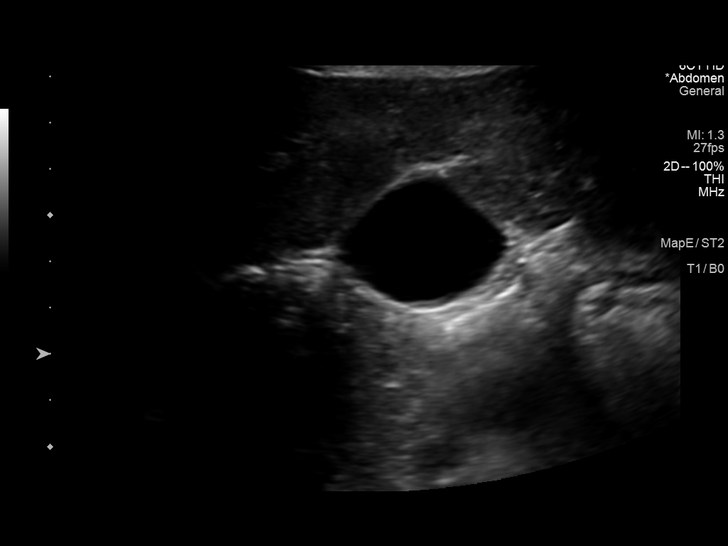
[im 20/47]
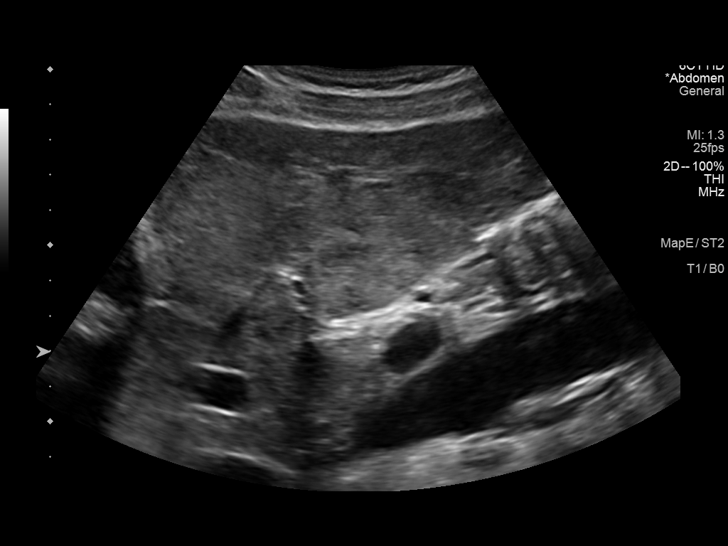
[im 24/47]
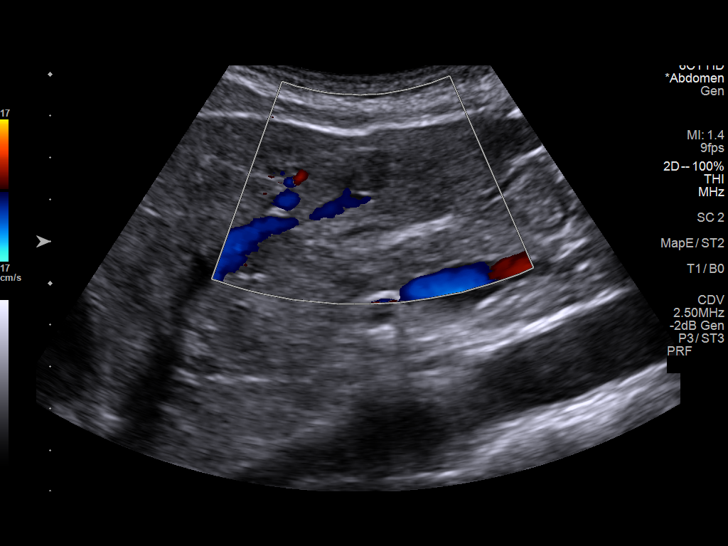
[im 27/47]
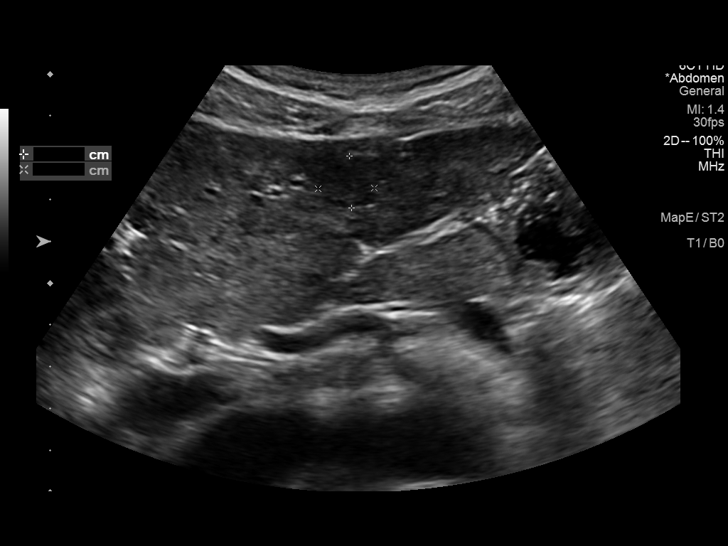
[im 31/47]
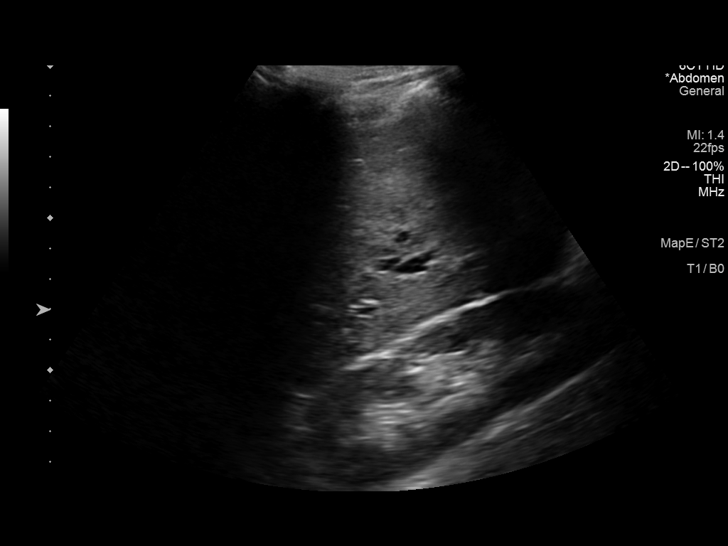
[im 35/47]
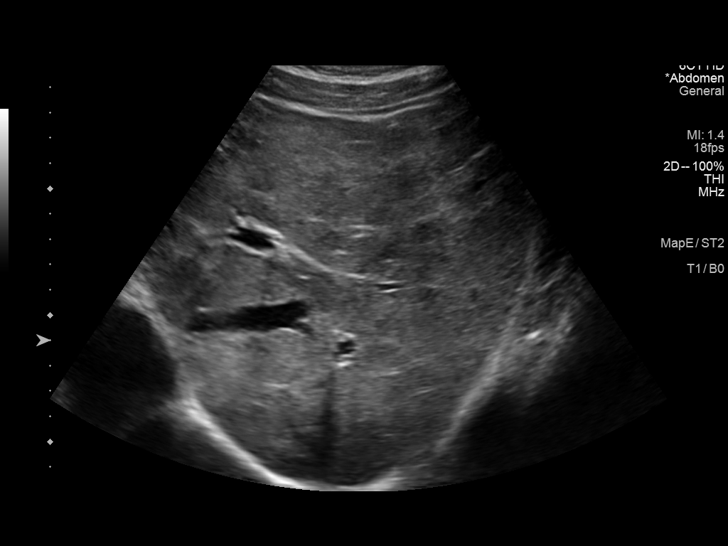
[im 39/47]
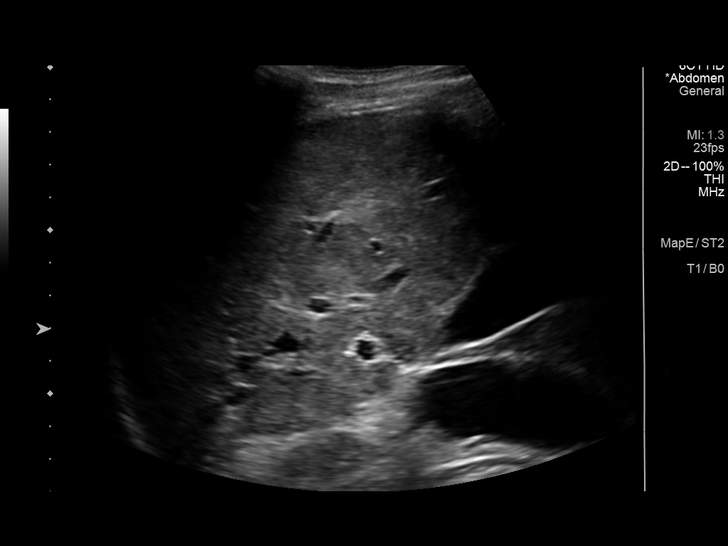
[im 43/47]
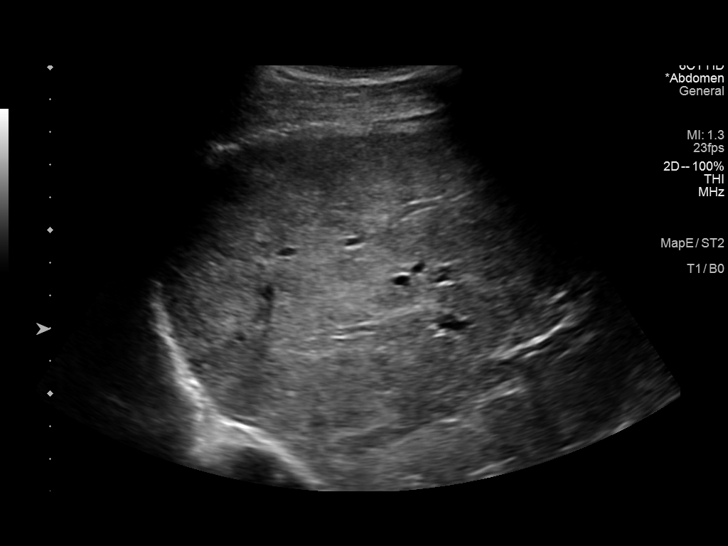
[im 47/47]
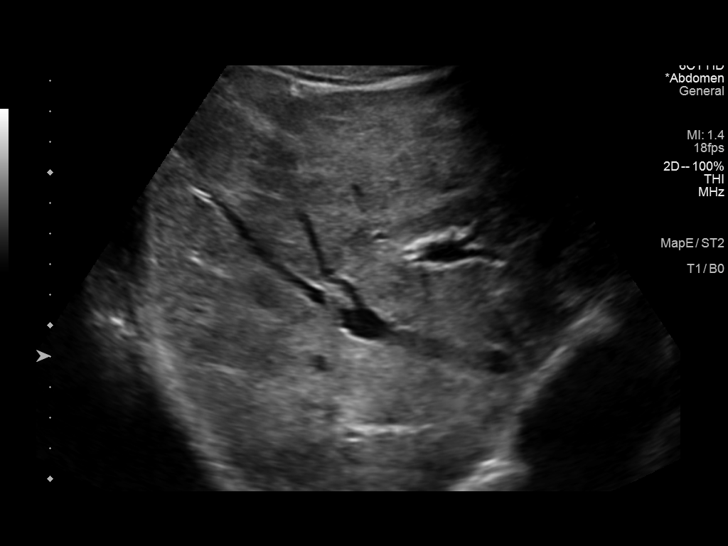

[13 of 25 positions shown; findings below may reference images not displayed]

FINDINGS: Gallbladder:

No gallstones or wall thickening visualized. There is no
pericholecystic fluid. No sonographic Murphy sign noted by
sonographer.

Common bile duct:

Diameter: 4 mm. No intrahepatic or extrahepatic biliary duct
dilatation.

Liver:

Focus of decreased attenuation in the left lobe of the liver
measuring 1.4 x 1.2 x 1.3 cm again noted, similar appearance
compared to prior study. No new focal liver lesions are evident.
Liver contour is slightly nodular. Liver echogenicity is mildly
inhomogeneous and somewhat coarsened as well as slightly increased.
Portal vein is patent on color Doppler imaging with normal direction
of blood flow towards the liver.

Other: None.
IMPRESSION: 1. Focal area of decreased attenuation in the left lobe of the liver
remains without change. Question area of dysplastic nodule. This
finding may warrant further evaluation with MR or CT of the liver
pre and post-contrast to further evaluate. At a minimum, continued
sonographic surveillance of this focal lesion warranted.

2. There are changes indicative of underlying hepatic cirrhosis,
grossly stable.

3.  Study otherwise unremarkable.

These results will be called to the ordering clinician or
representative by the Radiologist Assistant, and communication
documented in the PACS or [REDACTED].

## 2022-11-12 ENCOUNTER — Ambulatory Visit (HOSPITAL_BASED_OUTPATIENT_CLINIC_OR_DEPARTMENT_OTHER)
Admission: RE | Admit: 2022-11-12 | Discharge: 2022-11-12 | Disposition: A | Discharge status: Home / Self Care | Payer: BC Managed Care – PPO | Source: Ambulatory Visit | Attending: Family Medicine | Admitting: Family Medicine

## 2022-11-12 DIAGNOSIS — E7800 Pure hypercholesterolemia, unspecified: Secondary | ICD-10-CM | POA: Insufficient documentation

## 2023-02-09 ENCOUNTER — Other Ambulatory Visit: Payer: Self-pay | Admitting: Nurse Practitioner

## 2023-02-09 DIAGNOSIS — K7469 Other cirrhosis of liver: Secondary | ICD-10-CM

## 2023-04-12 ENCOUNTER — Ambulatory Visit
Admission: RE | Admit: 2023-04-12 | Discharge: 2023-04-12 | Disposition: A | Discharge status: Home / Self Care | Payer: BC Managed Care – PPO | Source: Ambulatory Visit | Attending: Nurse Practitioner | Admitting: Nurse Practitioner

## 2023-04-12 DIAGNOSIS — K7469 Other cirrhosis of liver: Secondary | ICD-10-CM

## 2023-06-10 IMAGING — US US ABDOMEN LIMITED
1 series · 14 of 25 positions shown · non-contrast
Comparison: 09/12/2020

CLINICAL DATA: Cryptogenic cirrhosis

EXAM:
ULTRASOUND ABDOMEN LIMITED RIGHT UPPER QUADRANT

[Series 1: us abdomen limited · 0.26mm/px · 14 of 43 slices shown]
[im 1/43]
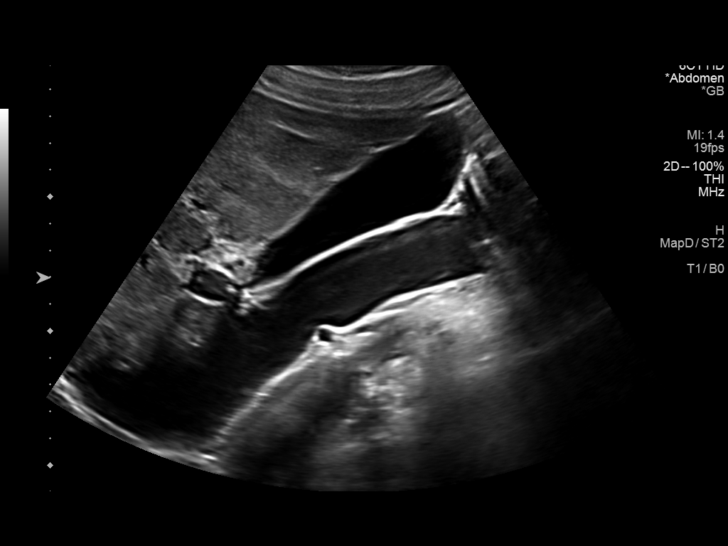
[im 4/43]
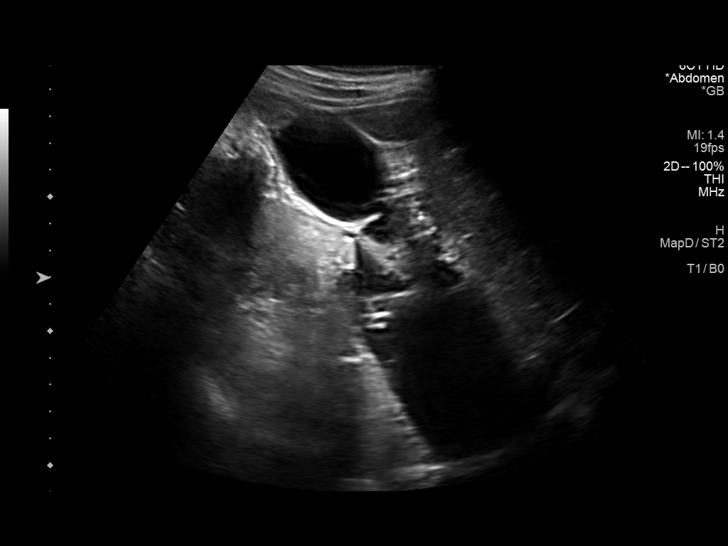
[im 8/43]
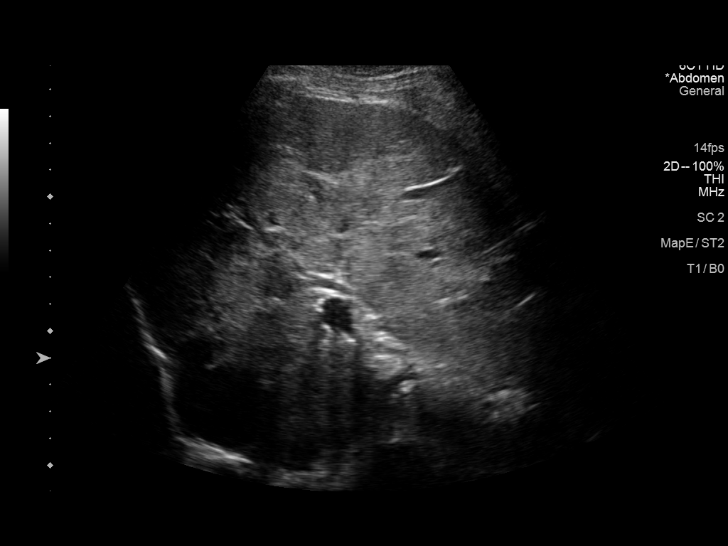
[im 11/43]
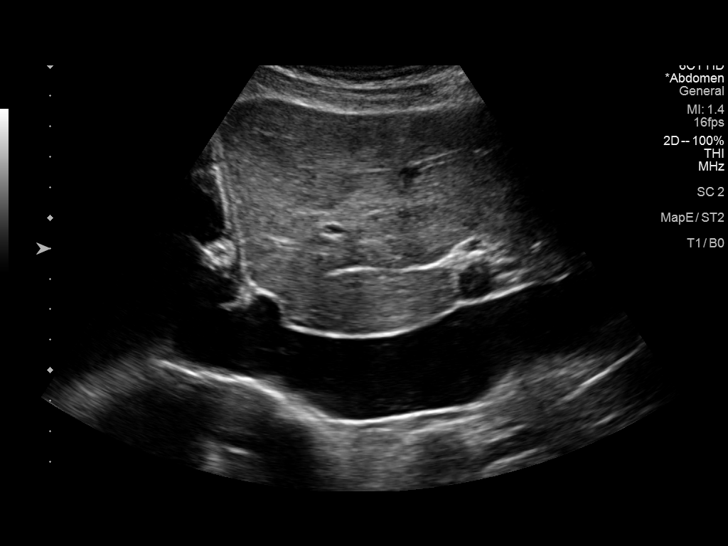
[im 15/43]
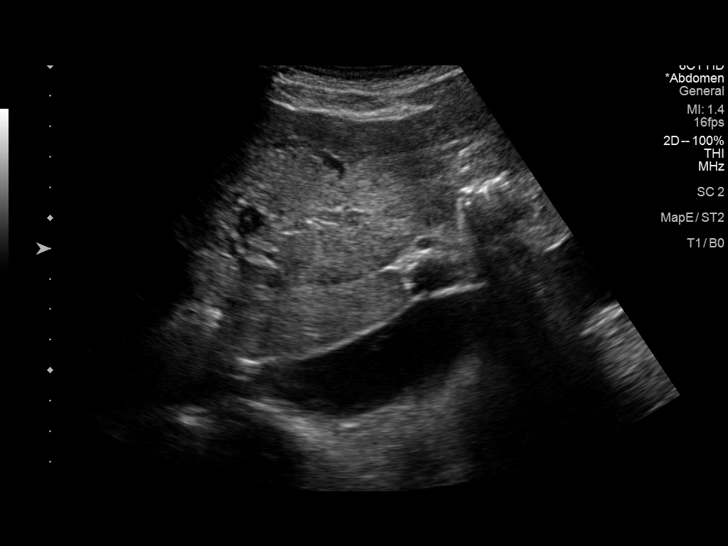
[im 16/43]
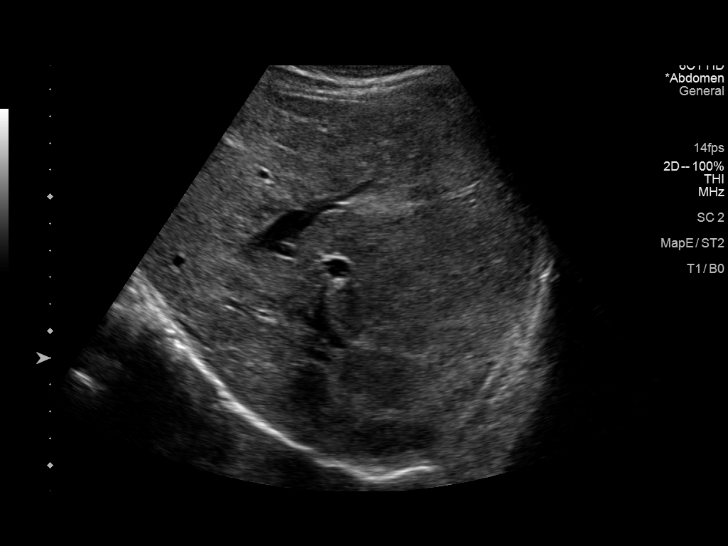
[im 20/43]
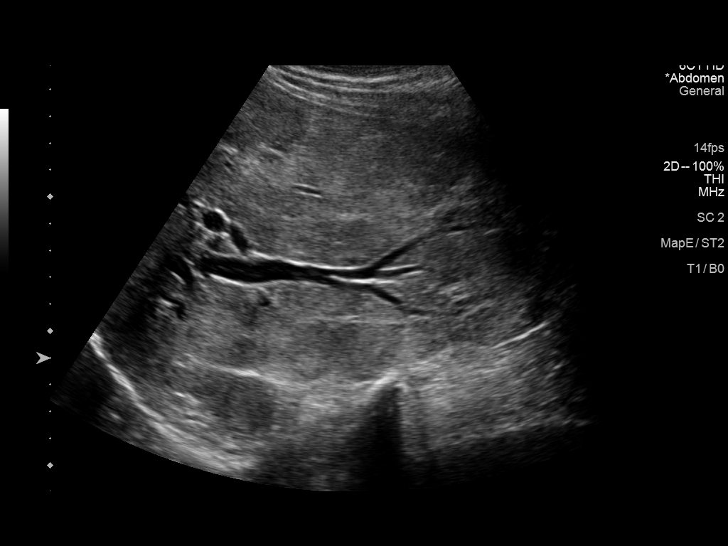
[im 23/43]
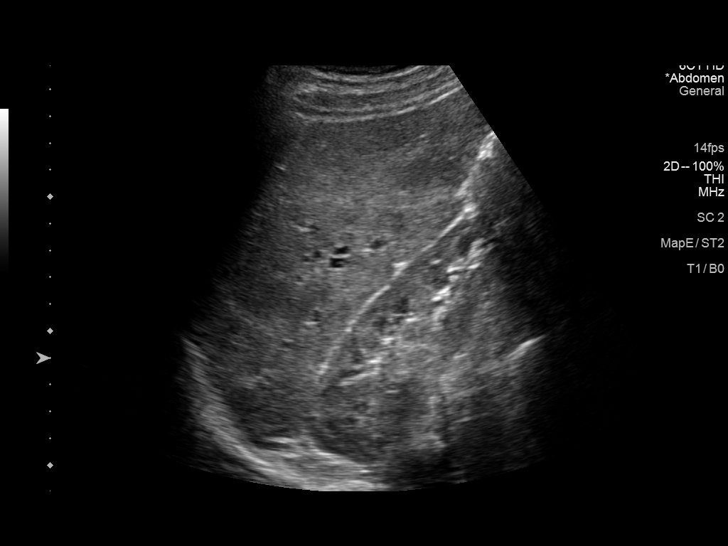
[im 27/43]
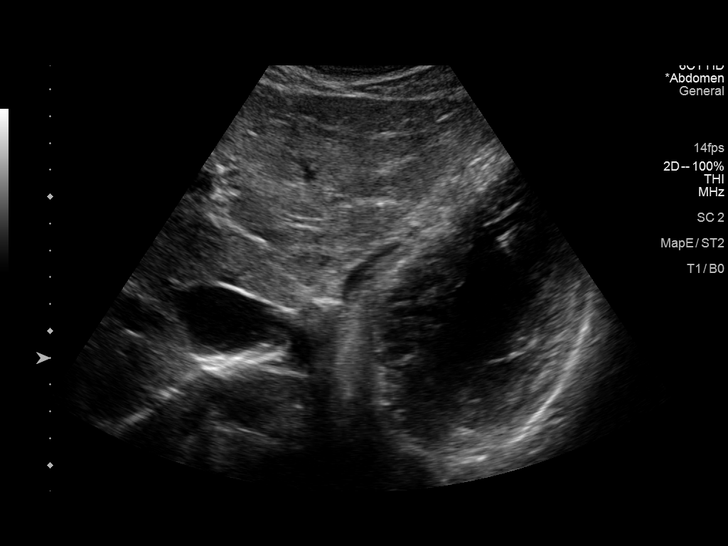
[im 29/43]
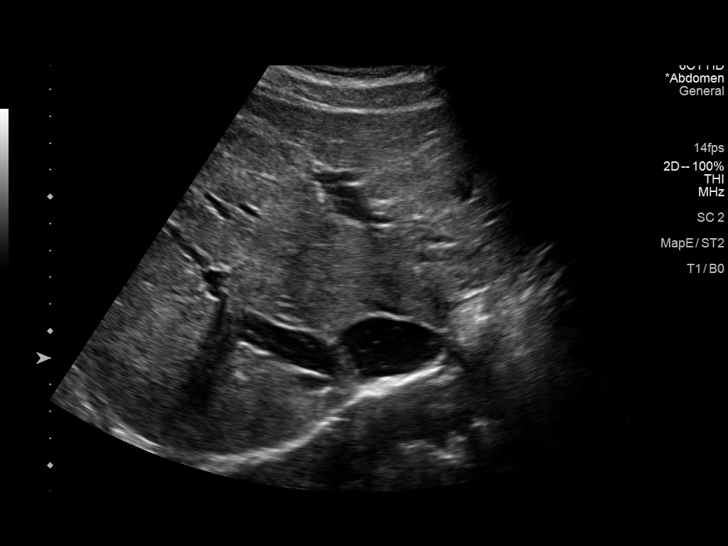
[im 32/43]
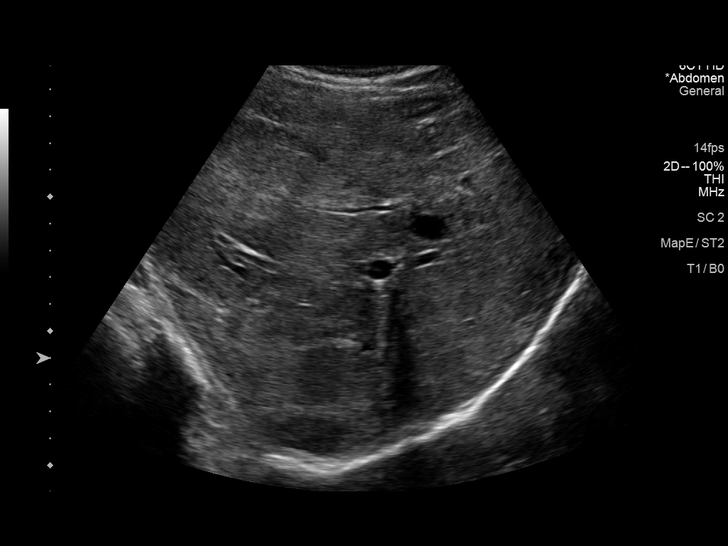
[im 36/43]
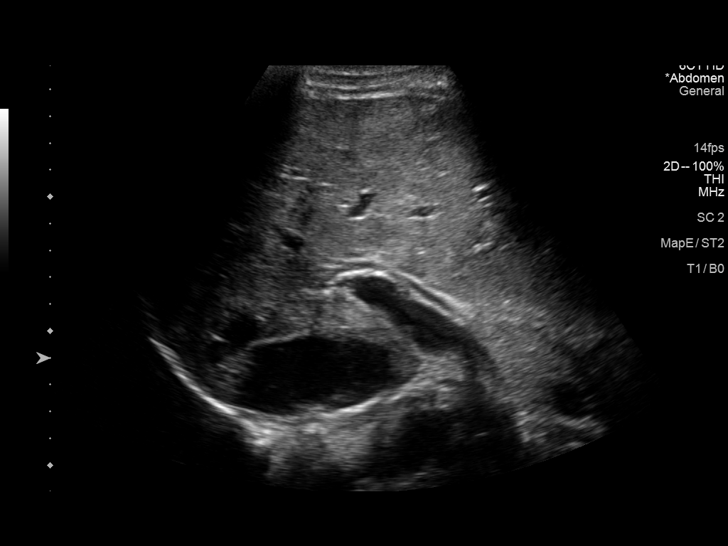
[im 39/43]
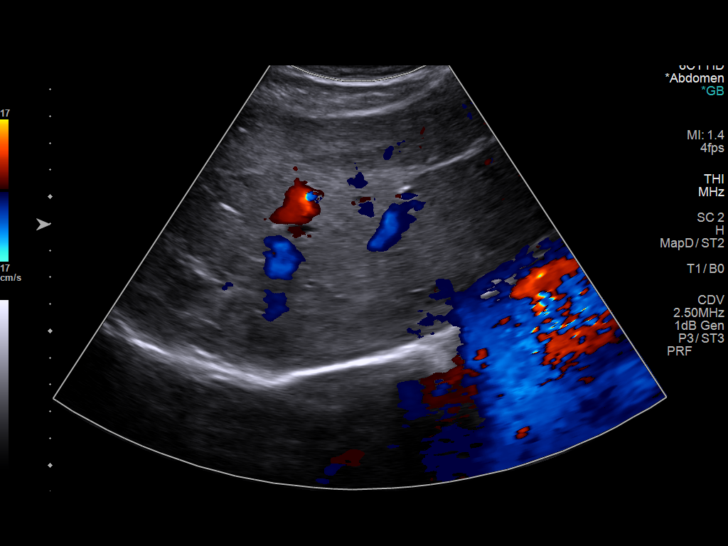
[im 43/43]
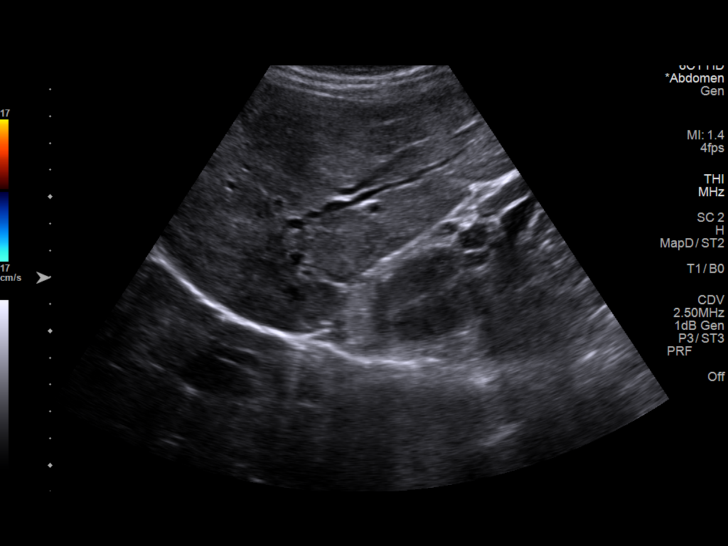

[14 of 25 positions shown; findings below may reference images not displayed]

FINDINGS: Gallbladder:

No gallstones or wall thickening visualized. No sonographic Murphy
sign noted by sonographer.

Common bile duct:

Diameter: 5 mm

Liver:

The liver is diffusely heterogeneous. The discrete nodule within the
left lobe liver on prior study is not well visualized on today's
exam. No definitive liver mass is visualized. No intrahepatic duct
dilation. Portal vein is patent on color Doppler imaging with normal
direction of blood flow towards the liver.

Other: None.
IMPRESSION: 1. Diffuse heterogeneity of the liver parenchyma without discrete
abnormality on today's exam. The hypoechoic nodule seen within the
left lobe liver on previous study is not identified. Findings are
compatible with cirrhosis.
2. Otherwise unremarkable exam.

## 2024-03-27 IMAGING — US US ABDOMEN LIMITED
1 series · 14 of 25 positions shown · non-contrast
Comparison: Ultrasound abdomen 04/24/2021.

CLINICAL DATA: Follow-up exam.  History of cirrhosis.

EXAM:
ULTRASOUND ABDOMEN LIMITED RIGHT UPPER QUADRANT

[Series 1: us abdomen limited · 0.19mm/px · 70 acquisitions, 14 frames shown]
[im 1/70]
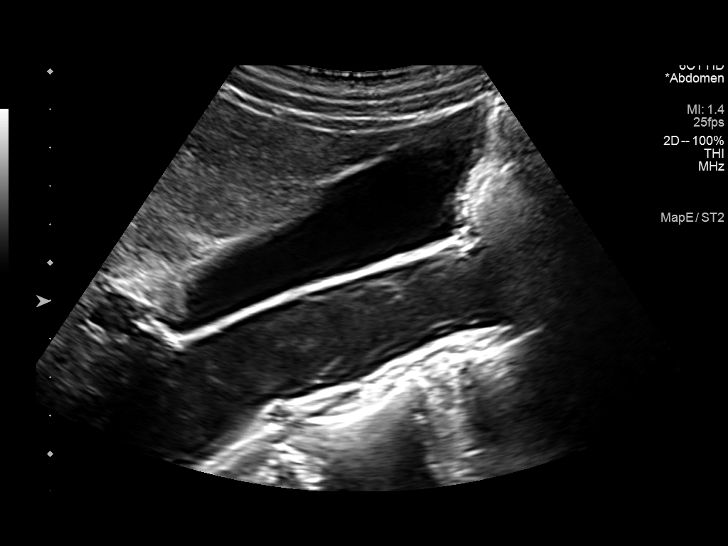
[im 6/70]
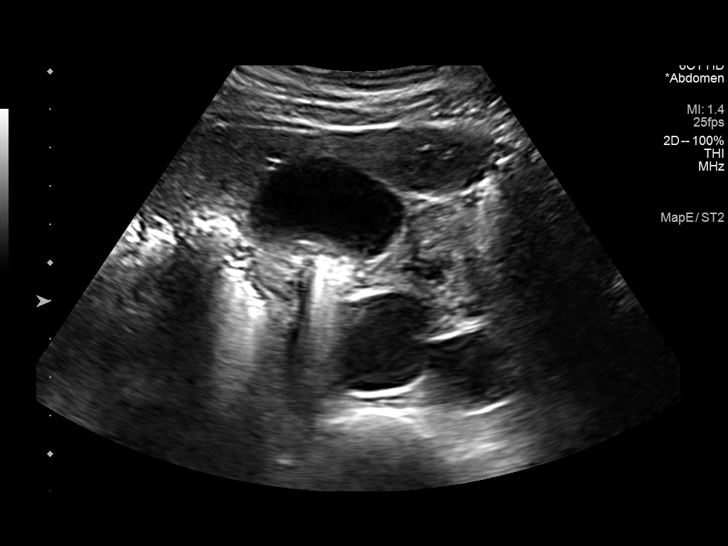
[im 12/70]
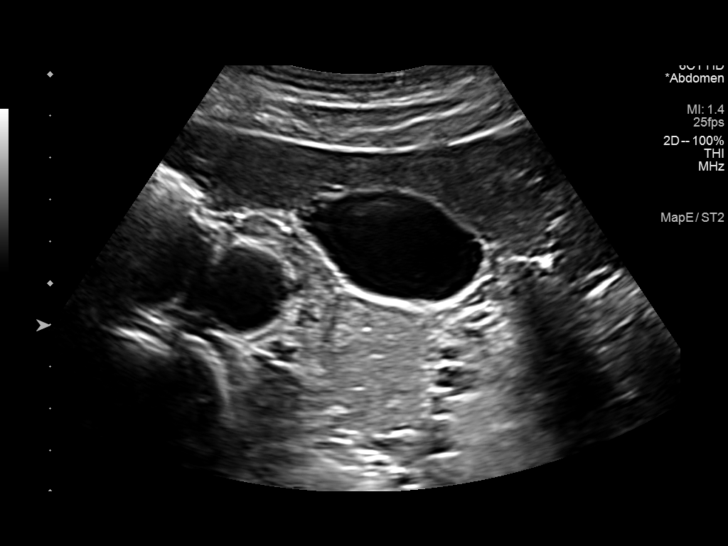
[im 18/70]
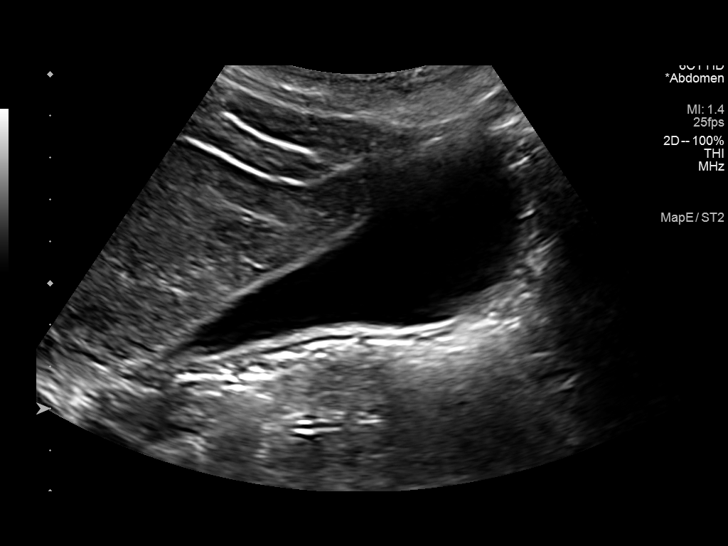
[im 24/70]
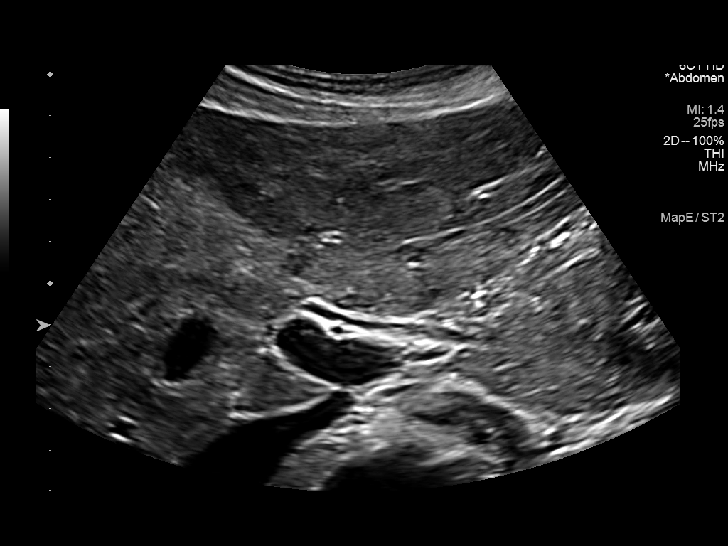
[im 26/70]
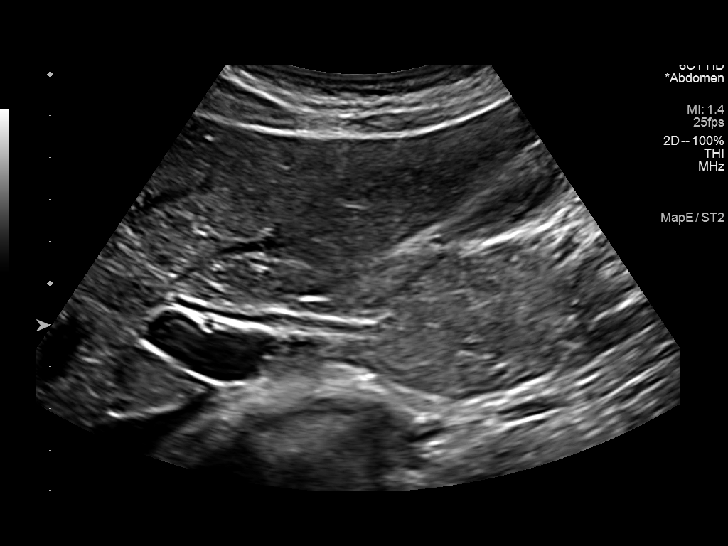
[im 32/70]
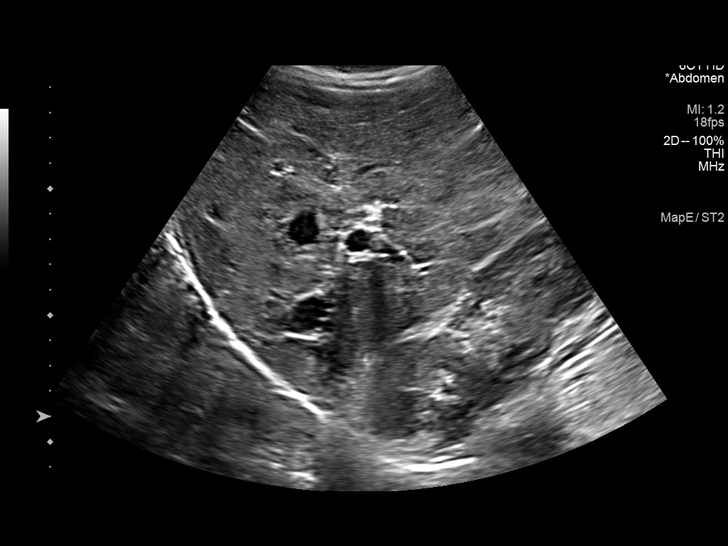
[im 38/70]
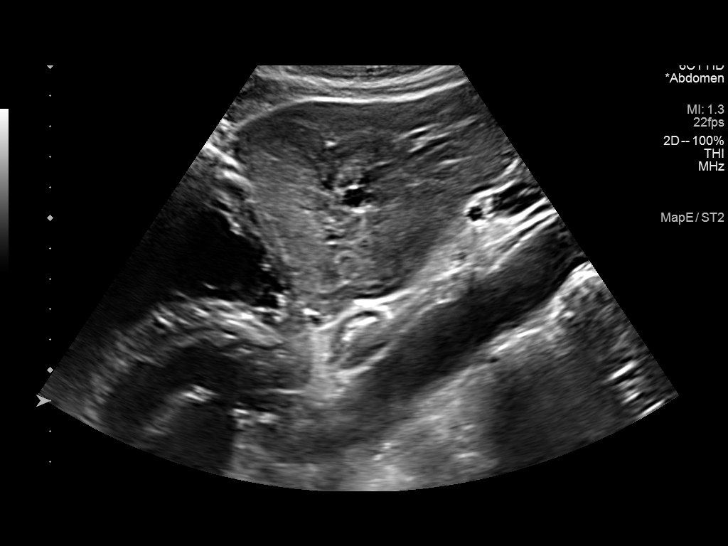
[im 44/70]
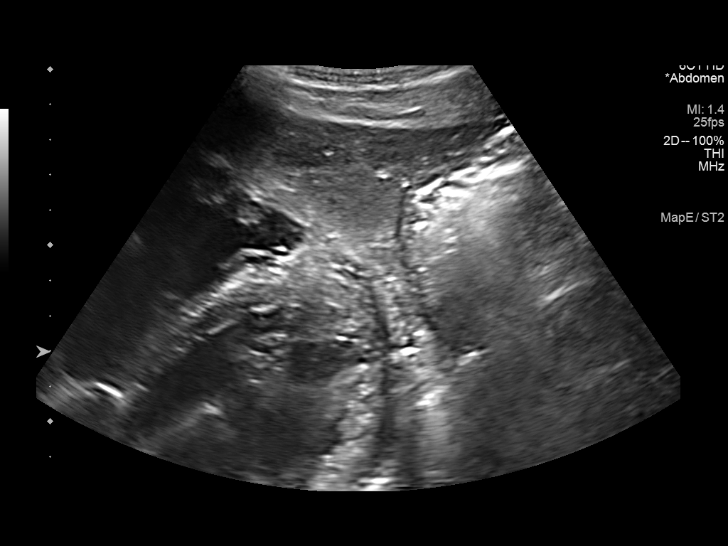
[im 47/70]
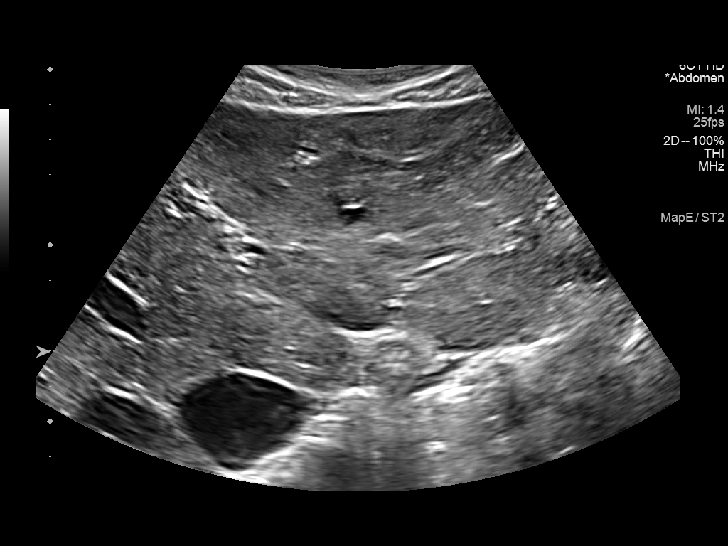
[im 52/70]
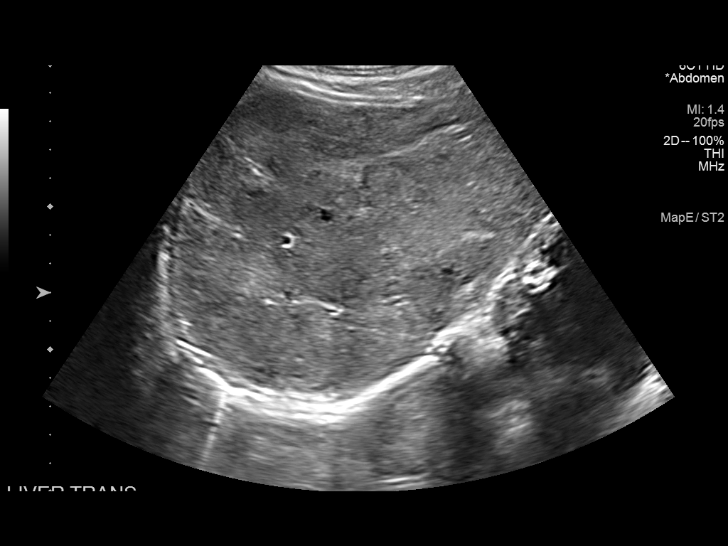
[im 58/70]
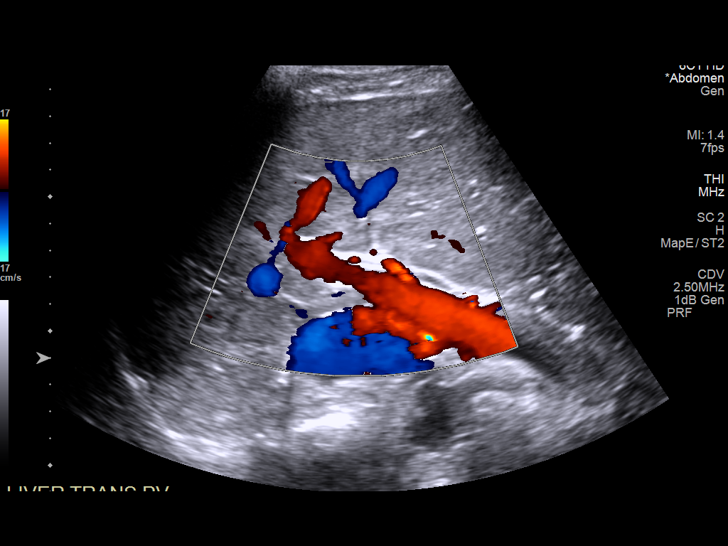
[im 64/70]
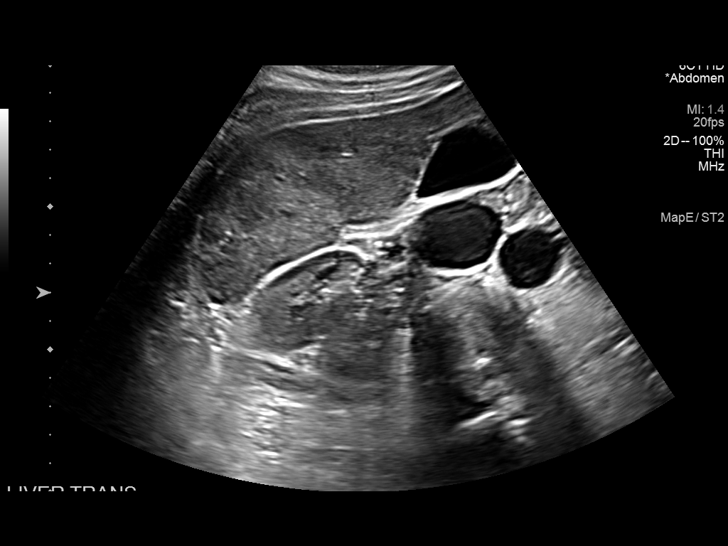
[im 70/70]
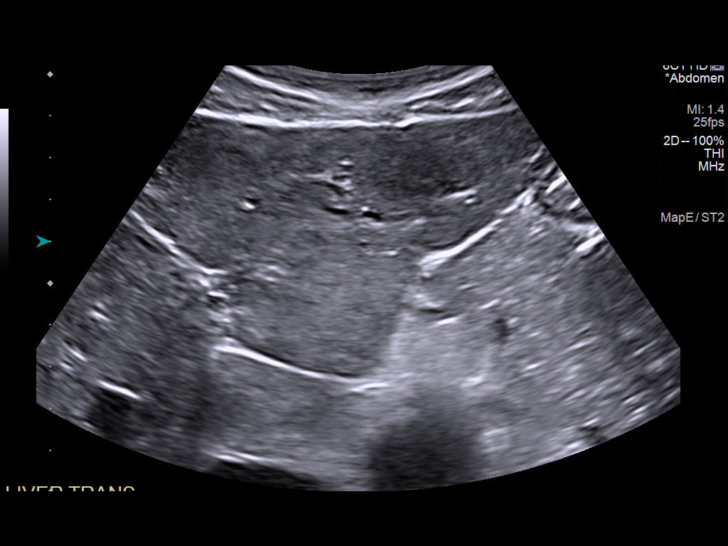

[14 of 25 positions shown; findings below may reference images not displayed]

FINDINGS: Gallbladder:

No gallstones or wall thickening visualized. No sonographic Murphy
sign noted by sonographer.

Common bile duct:

Diameter: 1.3 mm

Liver:

Morphologic changes compatible with cirrhosis. No focal identified
portal vein is patent on color Doppler imaging with normal direction
of blood flow towards the liver.

Other: None.
IMPRESSION: Morphologic changes patient morphologic changes with cirrhosis. No
definite focal lesion identified.

## 2024-06-16 ENCOUNTER — Other Ambulatory Visit: Payer: Self-pay | Admitting: Nurse Practitioner

## 2024-06-16 DIAGNOSIS — D376 Neoplasm of uncertain behavior of liver, gallbladder and bile ducts: Secondary | ICD-10-CM

## 2024-06-16 DIAGNOSIS — K7469 Other cirrhosis of liver: Secondary | ICD-10-CM

## 2024-07-03 ENCOUNTER — Ambulatory Visit
Admission: RE | Admit: 2024-07-03 | Discharge: 2024-07-03 | Disposition: A | Discharge status: Home / Self Care | Source: Ambulatory Visit | Attending: Nurse Practitioner | Admitting: Nurse Practitioner

## 2024-07-03 DIAGNOSIS — K7469 Other cirrhosis of liver: Secondary | ICD-10-CM

## 2024-07-03 DIAGNOSIS — D376 Neoplasm of uncertain behavior of liver, gallbladder and bile ducts: Secondary | ICD-10-CM
